# Patient Record
Sex: Female | Born: 1975 | Race: Black or African American | Hispanic: No | Marital: Single | Smoking: Former smoker
Health system: Southern US, Community
[De-identification: ages and names within clinical notes are randomized; demographics above are authoritative.]

## PROBLEM LIST (undated history)

## (undated) DIAGNOSIS — F32A Depression, unspecified: Secondary | ICD-10-CM

## (undated) DIAGNOSIS — M5126 Other intervertebral disc displacement, lumbar region: Secondary | ICD-10-CM

## (undated) DIAGNOSIS — F419 Anxiety disorder, unspecified: Secondary | ICD-10-CM

## (undated) HISTORY — DX: Other intervertebral disc displacement, lumbar region: M51.26

## (undated) HISTORY — DX: Depression, unspecified: F32.A

## (undated) HISTORY — PX: OTHER SURGICAL HISTORY: SHX169

## (undated) HISTORY — DX: Anxiety disorder, unspecified: F41.9

---

## 2016-11-06 ENCOUNTER — Ambulatory Visit: Payer: Medicaid Other | Attending: Internal Medicine | Admitting: Physical Therapy

## 2016-11-06 DIAGNOSIS — M545 Low back pain, unspecified: Secondary | ICD-10-CM

## 2016-11-06 DIAGNOSIS — M62830 Muscle spasm of back: Secondary | ICD-10-CM

## 2016-11-07 NOTE — Therapy (Signed)
[facility] [address] [place], [place], [address] Phone: [phone]   Fax:  [phone]  Physical Therapy Evaluation  Patient Details  Name: Robin Wright MRN: [id number] Date of Birth: 13-Jul-1976 Referring Provider: Dr Lauralee Evener  Encounter Date: 11/06/2016      PT End of Session - 11/07/16 1122    Visit Number 1   Number of Visits 1   Date for PT Re-Evaluation 11/07/16   Authorization Type [employer] 1x visit    PT Start Time 1100   PT Stop Time 1145   PT Time Calculation (min) 45 min   Activity Tolerance Patient tolerated treatment well   Behavior During Therapy [facility] for tasks assessed/performed      No past medical history on file.  No past surgical history on file.  There were no vitals filed for this visit.       Subjective Assessment - 11/07/16 1008    Subjective Patrient is a 40 year old female with lower back pain. She has core weakness and bilateral lower extremity weakness. She has limited spinal mobility with flexion and extension. She has increased pain with ambulation.     Limitations Standing;Walking   How long can you sit comfortably? < 20 min    How long can you stand comfortably? < 5 min before she feels pain    How long can you walk comfortably? < 300 ft before she feels pain    Currently in Pain? Yes   Pain Score 8    Pain Location Back   Pain Orientation Lower;Mid   Pain Descriptors / Indicators Aching   Pain Type Chronic pain   Pain Onset More than a month ago   Pain Frequency Intermittent   Aggravating Factors  standing, walking, prolonged sitting    Pain Relieving Factors rest    Effect of Pain on Daily Activities difficulty perfroming ADL's    Multiple Pain Sites No            [facility] PT Assessment - 11/07/16 0001      Assessment   Medical Diagnosis Low back pain    Referring Provider Dr Lauralee Evener   Onset Date/Surgical Date --  2010   Hand Dominance Right   Next MD  Visit None    Prior Therapy Yes in [place] for the lower back.      Precautions   Precautions None     Restrictions   Weight Bearing Restrictions No     Balance Screen   Has the patient fallen in the past 6 months No     Home Environment   Additional Comments Patient lives at home. 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  She also has several children at home.      Prior Function   Vocation Requirements Takes care of her mother    Leisure Micah Flesher to a gym up in [place]      Cognition   Overall Cognitive Status Within Functional Limits for tasks assessed   Attention Focused     Observation/Other Assessments   Focus on Therapeutic Outcomes (FOTO)  Medicaid      Sensation   Additional Comments Pain into the top of the foot on the right side.  Pain down into the left side.      Coordination   Gross Motor Movements are Fluid and Coordinated Yes   Fine Motor Movements are Fluid and Coordinated Yes     Posture/Postural Control  Posture Comments rounded shoulders, flexed forward in sittting      AROM   Lumbar Flexion limited 50% with pain    Lumbar Extension limited 50% with pain    Lumbar - Right Rotation limited 25% with pain    Lumbar - Left Rotation limited 25% with pain      PROM   Overall PROM Comments Limited hip flexion ER, and IR bilateral      Strength   Strength Assessment Site Hip   Right/Left Hip Right;Left   Right Hip Flexion 4/5   Right Hip Extension 4/5   Right Hip ABduction 4/5   Right Hip ADduction 4/5   Left Hip Flexion 4/5   Left Hip Extension 4/5   Left Hip ABduction 4/5   Left Hip ADduction 4/5     Palpation   SI assessment  Limited SI movement    Palpation comment spasming in bilateral lumbar paraspinals      Special Tests    Special Tests --  SLR (-) bilateral      Ambulation/Gait   Gait Comments limited stride length, limited hip rotation                            PT Education - 11/07/16 1122     Education provided Yes   Education Details HEP, significant education on symptom mangement and progression of activity    Person(s) Educated Patient   Methods Explanation;Demonstration   Comprehension Verbalized understanding;Returned demonstration          PT Short Term Goals - 11/07/16 1132      PT SHORT TERM GOAL #1   Title Patient will be indepdnent with HEP    Time 1   Period Days   Status Achieved     PT SHORT TERM GOAL #2   Title Patient will verbalize the improtance of symptom mangement    Time 1   Period Days   Status New                  Plan - 11/07/16 1124    Clinical Impression Statement Patient is a 40 year old female with low back pain. She presents with core and lower extremity weakness. She has limited hip mobility. She was given a porgram along with exercise progression.    Rehab Potential Good   PT Frequency 2x / week   PT Duration 8 weeks   PT Treatment/Interventions Moist Heat;Traction;Ultrasound;ADLs/Self Care Home Management;Cryotherapy;Electrical Stimulation;Stair training;Gait training;Functional mobility training;Therapeutic activities;Therapeutic exercise;Neuromuscular re-education;Cognitive remediation;Manual techniques;Patient/family education;Dry needling;Splinting;Taping   PT Next Visit Plan 1x visit per medicaid    PT Home Exercise Plan see patient instructions    Consulted and Agree with Plan of Care Patient      Patient will benefit from skilled therapeutic intervention in order to improve the following deficits and impairments:  Abnormal gait, Pain, Obesity, Decreased strength, Decreased mobility, Decreased activity tolerance, Decreased endurance, Difficulty walking, Decreased range of motion, Increased fascial restricitons  Visit Diagnosis: Acute bilateral low back pain without sciatica - Plan: PT plan of care cert/re-cert  Muscle spasm of back - Plan: PT plan of care cert/re-cert     Problem List There are no active  problems to display for this patient.   Dessie Coma PT DPT  11/07/2016, 11:38 AM  [facility] [address] [place], [place], [address] Phone: [phone]   Fax:  [phone]  Name: Monnette Muellner MRN:  [id number] Date of Birth: 1976/01/10

## 2016-12-26 ENCOUNTER — Ambulatory Visit (INDEPENDENT_AMBULATORY_CARE_PROVIDER_SITE_OTHER): Payer: Medicaid Other | Admitting: Specialist

## 2016-12-26 ENCOUNTER — Encounter (INDEPENDENT_AMBULATORY_CARE_PROVIDER_SITE_OTHER): Payer: Self-pay | Admitting: Specialist

## 2016-12-26 DIAGNOSIS — G8929 Other chronic pain: Secondary | ICD-10-CM

## 2016-12-26 DIAGNOSIS — M5441 Lumbago with sciatica, right side: Secondary | ICD-10-CM | POA: Diagnosis not present

## 2016-12-26 MED ORDER — CYCLOBENZAPRINE HCL 10 MG PO TABS: 10.0000 mg | ORAL_TABLET | Freq: Three times a day (TID) | ORAL | 2 refills | Status: DC | PRN

## 2016-12-26 MED ORDER — NAPROXEN 500 MG PO TABS: 500.0000 mg | ORAL_TABLET | Freq: Two times a day (BID) | ORAL | 3 refills | Status: DC

## 2016-12-26 NOTE — Patient Instructions (Signed)
Avoid frequent bending and stooping  No lifting greater than 10 lbs. May use ice or moist heat for pain. Weight loss is of benefit. Handicap license is approved.   

## 2016-12-26 NOTE — Progress Notes (Signed)
Office Visit Note   Patient: Robin Wright           Date of Birth: 05/10/1976           MRN: [id number] Visit Date: 12/26/2016              Requested by: Fleet ContrasEdwin Avbuere, MD [address] [place], [place] [id number] PCP: Dorrene GermanEdwin A Avbuere, MD   Assessment & Plan: Visit Diagnoses:  1. Chronic right-sided low back pain with right-sided sciatica     Plan: Avoid frequent bending and stooping  No lifting greater than 10 lbs. May use ice or moist heat for pain. Weight loss is of benefit. Handicap license is approved.    Follow-Up Instructions: No Follow-up on file.   Orders:  Orders Placed This Encounter  Procedures  . MR LUMBAR SPINE WO CONTRAST   Meds ordered this encounter  Medications  . cyclobenzaprine (FLEXERIL) 10 MG tablet    Sig: Take 1 tablet (10 mg total) by mouth 3 (three) times daily as needed for muscle spasms.    Dispense:  60 tablet    Refill:  2  . naproxen (NAPROSYN) 500 MG tablet    Sig: Take 1 tablet (500 mg total) by mouth 2 (two) times daily with a meal.    Dispense:  60 tablet    Refill:  3      Procedures: No procedures performed   Clinical Data: No additional findings.   Subjective: Chief Complaint  Patient presents with  . Lower Back - Pain  . Neck - Pain    Ms. Robin Wright is here for low back pain, she was referred by Dr. Winifred OliveAvburee.  She had xrays on 10/24/2016 @ [facility]. She states that she has low back pain that radiates into the right hip and leg.  Her hip gets really sore with increased activity or with just sitting to long.  [place] in [place] in 2010 of her Lumbar spine. She also states that she has pain in her neck and complaining on numbness and tingling in her hands if she sits to long or driving to long.    Review of Systems  HENT: Positive for sinus pressure and sneezing.   Eyes: Negative.   Respiratory: Positive for cough.   Cardiovascular: Negative.   Gastrointestinal: Negative.     Endocrine: Negative.   Genitourinary: Negative.   Musculoskeletal: Positive for back pain, gait problem and joint swelling.  Skin: Negative.   Allergic/Immunologic: Negative.   Neurological: Positive for dizziness, weakness and numbness.  Hematological: Negative.   Psychiatric/Behavioral: Negative.      Objective: Vital Signs: There were no vitals taken for this visit.  Physical Exam  Constitutional: She is oriented to person, place, and time. She appears well-developed and well-nourished.  HENT:  Head: Normocephalic and atraumatic.  Eyes: EOM are normal. Pupils are equal, round, and reactive to light.  Neck: Normal range of motion. Neck supple.  Pulmonary/Chest: Effort normal and breath sounds normal.  Abdominal: Soft. Bowel sounds are normal.  Neurological: She is alert and oriented to person, place, and time.  Skin: Skin is warm and dry.  Psychiatric: She has a normal mood and affect. Her behavior is normal. Judgment and thought content normal.    Back Exam   Tenderness  The patient is experiencing tenderness in the lumbar.  Range of Motion  Extension:  10 abnormal  Flexion:  40 abnormal  Lateral Bend Right: normal  Lateral Bend  Left: normal  Rotation Right: normal  Rotation Left: normal   Muscle Strength  Right Quadriceps:  5/5  Left Quadriceps:  5/5  Right Hamstrings:  5/5  Left Hamstrings:  5/5   Tests  Straight leg raise right: negative Straight leg raise left: negative  Reflexes  Patellar: normal Achilles: normal Babinski's sign: normal   Other  Toe Walk: normal Heel Walk: normal Sensation: normal Gait: abnormal  Erythema: no back redness Scars: absent  Comments:  Thigh  Circumference is symmetric, calf circumference is symmetric      Specialty Comments:  No specialty comments available.  Imaging: No results found.   PMFS History: There are no active problems to display for this patient.  No past medical history on file.  No  family history on file.  No past surgical history on file. Social History   Occupational History  . Not on file.   Social History Main Topics  . Smoking status: Current Every Day Smoker    Packs/day: 0.50    Types: Cigarettes    Start date: 12/26/2001  . Smokeless tobacco: Current User    Types: Snuff  . Alcohol use No  . Drug use: No  . Sexual activity: Not on file

## 2017-01-19 ENCOUNTER — Ambulatory Visit
Admission: RE | Admit: 2017-01-19 | Discharge: 2017-01-19 | Disposition: A | Discharge status: Home / Self Care | Payer: Medicaid Other | Source: Ambulatory Visit | Attending: Specialist | Admitting: Specialist

## 2017-02-04 ENCOUNTER — Ambulatory Visit (INDEPENDENT_AMBULATORY_CARE_PROVIDER_SITE_OTHER): Payer: Medicaid Other | Admitting: Specialist

## 2017-02-07 ENCOUNTER — Ambulatory Visit (INDEPENDENT_AMBULATORY_CARE_PROVIDER_SITE_OTHER): Payer: Medicaid Other | Admitting: Specialist

## 2017-04-11 ENCOUNTER — Encounter (INDEPENDENT_AMBULATORY_CARE_PROVIDER_SITE_OTHER): Payer: Self-pay | Admitting: Specialist

## 2017-04-11 ENCOUNTER — Ambulatory Visit (INDEPENDENT_AMBULATORY_CARE_PROVIDER_SITE_OTHER): Payer: Medicaid Other | Admitting: Specialist

## 2017-04-11 VITALS — BP 125/82 | HR 88 | Ht 66.0 in | Wt 318.0 lb

## 2017-04-11 DIAGNOSIS — G8929 Other chronic pain: Secondary | ICD-10-CM | POA: Diagnosis not present

## 2017-04-11 DIAGNOSIS — R202 Paresthesia of skin: Secondary | ICD-10-CM

## 2017-04-11 DIAGNOSIS — R200 Anesthesia of skin: Secondary | ICD-10-CM | POA: Diagnosis not present

## 2017-04-11 DIAGNOSIS — M5440 Lumbago with sciatica, unspecified side: Secondary | ICD-10-CM | POA: Diagnosis not present

## 2017-04-11 DIAGNOSIS — M5441 Lumbago with sciatica, right side: Secondary | ICD-10-CM | POA: Diagnosis not present

## 2017-04-11 DIAGNOSIS — M5136 Other intervertebral disc degeneration, lumbar region: Secondary | ICD-10-CM | POA: Diagnosis not present

## 2017-04-11 MED ORDER — CYCLOBENZAPRINE HCL 10 MG PO TABS: 10.0000 mg | ORAL_TABLET | Freq: Three times a day (TID) | ORAL | 2 refills | Status: DC | PRN

## 2017-04-11 MED ORDER — GABAPENTIN 100 MG PO CAPS: 100.0000 mg | ORAL_CAPSULE | Freq: Three times a day (TID) | ORAL | 6 refills | Status: AC

## 2017-04-11 MED ORDER — NAPROXEN 500 MG PO TABS: 500.0000 mg | ORAL_TABLET | Freq: Two times a day (BID) | ORAL | 3 refills | Status: DC

## 2017-04-11 NOTE — Progress Notes (Addendum)
Office Visit Note   Patient: Robin Wright           Date of Birth: 11-24-1975           MRN: [id number] Visit Date: 04/11/2017              Requested by: Fleet Contras, MD [address] [place], [place] [address] PCP: Fleet Contras, MD   Assessment & Plan: Visit Diagnoses:  1. Degenerative disc disease, lumbar   2. Bilateral low back pain with sciatica, sciatica laterality unspecified, unspecified chronicity   3. Numbness and tingling of both feet   4. Bilateral hand numbness   5. Chronic right-sided low back pain with right-sided sciatica   41 year old female with low back pain, brother that is disabled and has carpal tunnel syndrome. She complains of falls and lower extremity weakness, injury to her back In [place], work related and is seeking disability evaluation in [place], her complaints may be due to carpal tunnel and tarsal tunnel syndrome. Her MRI doesn't explain her  Level of disability and falling episodes. I recommend a Neurology evaluation, there is no present surgical lesion, however neurology eval with EMGs/NCV may help determine If she needs treatment of a carpal tunnel syndrome. She is obese and needs to consider weight loss as this may be the most beneficial treatment to present premature Wear and degeneration of her joints and spine. I will send back to physical therapy, recommend use of NSAIDs, weight loss, exercise and thorough evaluation for periphral neuropathy.   Plan:Avoid frequent bending and stooping  No lifting greater than 10-15 lbs. May use ice or moist heat for pain. Weight loss is of benefit.  Follow-Up Instructions: Return in about 3 months (around 07/12/2017).   Orders:  Orders Placed This Encounter  Procedures  . Ambulatory referral to Neurology   Meds ordered this encounter  Medications  . cyclobenzaprine (FLEXERIL) 10 MG tablet    Sig: Take 1 tablet (10 mg total) by mouth 3 (three) times daily as needed for muscle spasms.    Dispense:   60 tablet    Refill:  2  . naproxen (NAPROSYN) 500 MG tablet    Sig: Take 1 tablet (500 mg total) by mouth 2 (two) times daily with a meal.    Dispense:  60 tablet    Refill:  3  . gabapentin (NEURONTIN) 100 MG capsule    Sig: Take 1 capsule (100 mg total) by mouth 3 (three) times daily.    Dispense:  30 capsule    Refill:  6      Procedures: No procedures performed   Clinical Data: No additional findings.   Subjective: Chief Complaint  Patient presents with  . Lower Back - Pain    41 year old female with history of low back pain and pain with bending and stooping and standing too long. She has pain with heavy lifting. 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 Her brother has carpal tunnel and arthritis and diabetes. She does not have diabetes. Told that her sugars. She is applying for SSDI, And is employing a lawyer to assist with her case. She loses her balance a lot and is concern about falls.     Review of Systems  Constitutional: Positive for appetite change and fatigue.  HENT: Negative.  Negative for sinus pain, sinus pressure and sore throat.   Eyes: Negative.  Negative for photophobia, pain,  redness and visual disturbance.  Respiratory: Positive for cough and shortness of breath. Negative for chest tightness and wheezing.   Cardiovascular: Negative.  Negative for leg swelling.  Gastrointestinal: Negative.  Negative for constipation, diarrhea, nausea, rectal pain and vomiting.  Endocrine: Negative.   Genitourinary: Negative.  Negative for frequency and vaginal bleeding.  Musculoskeletal: Positive for back pain and gait problem.  Skin: Negative.  Negative for color change, pallor, rash and wound.  Allergic/Immunologic: Negative.   Neurological: Positive for weakness, light-headedness, numbness and headaches.  Hematological: Negative.   Psychiatric/Behavioral: Positive for dysphoric mood. Negative for suicidal  ideas. The patient is nervous/anxious.      Objective: Vital Signs: BP 125/82 (BP Location: Left Arm, Patient Position: Sitting)   Pulse 88   Ht 5\' 6"  (1.676 m)   Wt (!) 318 lb (144.2 kg)   BMI 51.33 kg/m   Physical Exam  Constitutional: She is oriented to person, place, and time. She appears well-developed and well-nourished.  HENT:  Head: Normocephalic and atraumatic.  Eyes: EOM are normal. Pupils are equal, round, and reactive to light.  Neck: Normal range of motion. Neck supple.  Pulmonary/Chest: Effort normal and breath sounds normal.  Abdominal: Soft. Bowel sounds are normal.  Neurological: She is alert and oriented to person, place, and time.  Skin: Skin is warm and dry.  Psychiatric: She has a normal mood and affect. Her behavior is normal. Judgment and thought content normal.    Back Exam   Tenderness  The patient is experiencing tenderness in the lumbar.  Range of Motion  Extension: abnormal  Flexion: abnormal  Lateral Bend Right: normal  Lateral Bend Left: normal  Rotation Right: normal  Rotation Left: normal   Muscle Strength  Right Quadriceps:  5/5  Left Quadriceps:  5/5  Right Hamstrings:  5/5  Left Hamstrings:  5/5   Tests  Straight leg raise right: negative Straight leg raise left: negative  Reflexes  Babinski's sign: normal   Other  Toe Walk: abnormal Heel Walk: abnormal Sensation: normal Gait: normal  Erythema: no back redness Scars: absent      Specialty Comments:  No specialty comments available.  Imaging: No results found.   PMFS History: There are no active problems to display for this patient.  No past medical history on file.  No family history on file.  No past surgical history on file. Social History   Occupational History  . Not on file.   Social History Main Topics  . Smoking status: Current Every Day Smoker    Packs/day: 0.50    Types: Cigarettes    Start date: 12/26/2001  . Smokeless tobacco: Current  User    Types: Snuff  . Alcohol use No  . Drug use: No  . Sexual activity: Not on file

## 2017-04-11 NOTE — Patient Instructions (Signed)
Avoid frequent bending and stooping  No lifting greater than 10-15 lbs. May use ice or moist heat for pain. Weight loss is of benefit.

## 2017-06-04 ENCOUNTER — Encounter: Payer: Self-pay | Admitting: Neurology

## 2017-06-04 ENCOUNTER — Ambulatory Visit (INDEPENDENT_AMBULATORY_CARE_PROVIDER_SITE_OTHER): Payer: Medicaid Other | Admitting: Neurology

## 2017-06-04 ENCOUNTER — Encounter (INDEPENDENT_AMBULATORY_CARE_PROVIDER_SITE_OTHER): Payer: Self-pay

## 2017-06-04 DIAGNOSIS — R413 Other amnesia: Secondary | ICD-10-CM | POA: Insufficient documentation

## 2017-06-04 DIAGNOSIS — M542 Cervicalgia: Secondary | ICD-10-CM | POA: Diagnosis not present
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
## 2017-06-06 ENCOUNTER — Telehealth: Payer: Self-pay | Admitting: Neurology

## 2017-06-06 LAB — CBC WITH DIFFERENTIAL/PLATELET
Basophils Absolute: 0 10*3/uL (ref 0.0–0.2)
Basos: 0 %
EOS (ABSOLUTE): 0.1 10*3/uL (ref 0.0–0.4)
Eos: 2 %
Hematocrit: 36.6 % (ref 34.0–46.6)
Hemoglobin: 10.6 g/dL — ABNORMAL LOW (ref 11.1–15.9)
IMMATURE GRANULOCYTES: 0 %
Immature Grans (Abs): 0 10*3/uL (ref 0.0–0.1)
LYMPHS ABS: 1.9 10*3/uL (ref 0.7–3.1)
Lymphs: 50 %
MCH: 24.5 pg — ABNORMAL LOW (ref 26.6–33.0)
MCHC: 29 g/dL — AB (ref 31.5–35.7)
MCV: 85 fL (ref 79–97)
MONOCYTES: 8 %
MONOS ABS: 0.3 10*3/uL (ref 0.1–0.9)
NEUTROS PCT: 40 %
Neutrophils Absolute: 1.5 10*3/uL (ref 1.4–7.0)
Platelets: 350 10*3/uL (ref 150–379)
RBC: 4.32 x10E6/uL (ref 3.77–5.28)
RDW: 18.4 % — ABNORMAL HIGH (ref 12.3–15.4)
WBC: 3.7 10*3/uL (ref 3.4–10.8)

## 2017-06-06 LAB — COMPREHENSIVE METABOLIC PANEL
A/G RATIO: 1.1 — AB (ref 1.2–2.2)
ALT: 13 IU/L (ref 0–32)
AST: 13 IU/L (ref 0–40)
Albumin: 3.5 g/dL (ref 3.5–5.5)
Alkaline Phosphatase: 59 IU/L (ref 39–117)
BILIRUBIN TOTAL: 0.2 mg/dL (ref 0.0–1.2)
BUN/Creatinine Ratio: 11 (ref 9–23)
BUN: 8 mg/dL (ref 6–24)
CHLORIDE: 104 mmol/L (ref 96–106)
CO2: 21 mmol/L (ref 20–29)
Calcium: 8.9 mg/dL (ref 8.7–10.2)
Creatinine, Ser: 0.74 mg/dL (ref 0.57–1.00)
GFR calc Af Amer: 117 mL/min/{1.73_m2} (ref >59)
GFR calc non Af Amer: 102 mL/min/{1.73_m2} (ref >59)
GLUCOSE: 89 mg/dL (ref 65–99)
Globulin, Total: 3.3 g/dL (ref 1.5–4.5)
POTASSIUM: 4.3 mmol/L (ref 3.5–5.2)
Sodium: 140 mmol/L (ref 134–144)
Total Protein: 6.8 g/dL (ref 6.0–8.5)

## 2017-06-06 LAB — C-REACTIVE PROTEIN: CRP: 8.4 mg/L — AB (ref 0.0–4.9)

## 2017-06-06 LAB — VITAMIN B12: VITAMIN B 12: 299 pg/mL (ref 232–1245)

## 2017-06-06 LAB — RPR: RPR: NONREACTIVE

## 2017-06-06 LAB — VITAMIN D 25 HYDROXY (VIT D DEFICIENCY, FRACTURES): Vit D, 25-Hydroxy: 14.8 ng/mL — ABNORMAL LOW (ref 30.0–100.0)

## 2017-06-06 LAB — SEDIMENTATION RATE: SED RATE: 15 mm/h (ref 0–32)

## 2017-06-06 LAB — ANA W/REFLEX IF POSITIVE: Anti Nuclear Antibody(ANA): NEGATIVE

## 2017-06-06 LAB — HIV ANTIBODY (ROUTINE TESTING W REFLEX): HIV SCREEN 4TH GENERATION: NONREACTIVE

## 2017-06-06 NOTE — Telephone Encounter (Signed)
She is aware of results and agreeable to start the recommended supplement.

## 2017-06-06 NOTE — Telephone Encounter (Signed)
Please call patient laboratory evaluation showed low vitamin D level 14.8, she should take over-the-counter vitamin D3 supplement 1000 units daily.  Also evidence of mild anemia, with hemoglobin of 10.6, rest of the laboratory showed no significant abnormality

## 2017-07-12 ENCOUNTER — Encounter (INDEPENDENT_AMBULATORY_CARE_PROVIDER_SITE_OTHER): Payer: Self-pay | Admitting: Specialist

## 2017-07-12 ENCOUNTER — Ambulatory Visit (INDEPENDENT_AMBULATORY_CARE_PROVIDER_SITE_OTHER): Payer: Medicaid Other | Admitting: Specialist

## 2017-07-12 VITALS — BP 103/68 | HR 90

## 2017-07-12 DIAGNOSIS — M47894 Other spondylosis, thoracic region: Secondary | ICD-10-CM

## 2017-07-12 DIAGNOSIS — M5136 Other intervertebral disc degeneration, lumbar region: Secondary | ICD-10-CM | POA: Diagnosis not present

## 2017-07-12 NOTE — Addendum Note (Signed)
Addended by: Vira Browns on: 07/12/2017 02:28 PM   Modules accepted: Level of Service

## 2017-07-12 NOTE — Patient Instructions (Addendum)
Avoid frequent bending and stooping  No lifting greater than 15 lbs. . May use ice or moist heat for pain. Weight loss is of benefit. Anemia needs to be worked up and you should see your primary Dr. [name] to have this worked up. When the MRI of the Brain and of the neck are complete we may be able to say more about why you are having  Falling out spells. Presently I do not have a good surgical solution to your lumbar and thoracic disc degeneration and I do not recommend Surgery.  I recommend a weight reduction program and this may be initiated by your primary care physician.

## 2017-07-12 NOTE — Progress Notes (Addendum)
Office Visit Note   Patient: Robin Wright           Date of Birth: 20-Jun-1976           MRN: [id number] Visit Date: 07/12/2017              Requested by: Fleet Contras, MD [address] [place], [place] [address] PCP: Fleet Contras, MD   Assessment & Plan: Visit Diagnoses:  1. Other osteoarthritis of spine, thoracic region   2. Degenerative disc disease, lumbar     Plan:Avoid frequent bending and stooping  No lifting greater than 10 lbs. May use ice or moist heat for pain. Weight loss is of benefit. Anemia needs to be worked up and you should see your primary Dr. Concepcion Elk to have this worked up. When the MRI of the Brain and of the neck are complete we may be able to say more about why you are having  Falling out spells. Presently I do not have a good surgical solution to your lumbar and thoracic disc degeneration and I do not recommend Surgery.  I recommend a weight reduction program and this may be initiated by your primary care physician.     Follow-Up Instructions: No Follow-up on file.   Orders:  No orders of the defined types were placed in this encounter.  No orders of the defined types were placed in this encounter.     Procedures: No procedures performed   Clinical Data: No additional findings.   Subjective: Chief Complaint  Patient presents with  . Lower Back - Pain    41 year old female with history of back pain starts in the lower back and shoots up into her neck. Standing too long causes pain and she is better with sitting and then when sitting too long she has to stand and walk. Some days she is unable to even drive because she can not turn her back. Also into her neck and between her shoulder blades. No bowel difficulties, reports she doe use the bathroom voiding more frequently. Cough or sneeze without pain, daughter assists with socks because it hurts to bend down.  She wears slip on shoes for ease of slipping on and off. Complains of  numbness in the right side more than left in all her limbs. She had nerve tests done by neurology in [place] and had them repeated in [place] in the past last done in 2016, like October. Has seen neurology, Dr. Terrace Arabia and underwent  Blood work demonstrating anemia with Hgb 10.6, WBC and Platelets are normal. Sed rate 15. She has concerns about financial costs and is unable to have the MRI of the cervical spine and the brain that Dr. Terrace Arabia felt was indicated.    Review of Systems  Constitutional: Positive for activity change and fatigue.  HENT: Negative.  Negative for congestion, dental problem, drooling, ear discharge, ear pain, facial swelling, hearing loss, mouth sores, nosebleeds, postnasal drip, rhinorrhea, sinus pain, sinus pressure, sneezing, sore throat, tinnitus, trouble swallowing and voice change.   Eyes: Positive for visual disturbance. Negative for pain, discharge, redness and itching.  Respiratory: Negative.  Negative for shortness of breath and wheezing.   Cardiovascular: Positive for leg swelling. Negative for chest pain and palpitations.  Gastrointestinal: Positive for abdominal pain. Negative for abdominal distention, blood in stool, constipation, diarrhea, nausea and vomiting.  Endocrine: Negative for heat intolerance.  Genitourinary: Negative for difficulty urinating, dyspareunia, dysuria, enuresis and flank pain.  Musculoskeletal: Positive for arthralgias,  back pain, gait problem, joint swelling, neck pain and neck stiffness.  Skin: Negative for color change, pallor, rash and wound.  Allergic/Immunologic: Negative for environmental allergies, food allergies and immunocompromised state.  Neurological: Positive for weakness and numbness. Negative for dizziness, seizures, syncope, facial asymmetry, speech difficulty, light-headedness and headaches.  Hematological: Does not bruise/bleed easily.  Psychiatric/Behavioral: Negative for agitation, behavioral problems, confusion,  decreased concentration, dysphoric mood, hallucinations, self-injury, sleep disturbance and suicidal ideas. The patient is not nervous/anxious and is not hyperactive.      Objective: Vital Signs: BP 103/68   Pulse 90   Physical Exam  Constitutional: She is oriented to person, place, and time. She appears well-developed and well-nourished.  HENT:  Head: Normocephalic and atraumatic.  Eyes: Pupils are equal, round, and reactive to light. EOM are normal.  Neck: Normal range of motion. Neck supple.  Pulmonary/Chest: Effort normal and breath sounds normal.  Abdominal: Soft. Bowel sounds are normal.  Neurological: She is alert and oriented to person, place, and time.  Skin: Skin is warm and dry.  Psychiatric: She has a normal mood and affect. Her behavior is normal. Judgment and thought content normal.    Back Exam   Tenderness  The patient is experiencing tenderness in the lumbar.  Range of Motion  Extension: normal  Flexion: normal  Lateral Bend Right: abnormal  Lateral Bend Left: abnormal  Rotation Right: abnormal  Rotation Left: abnormal   Muscle Strength  Right Quadriceps:  5/5  Left Quadriceps:  5/5  Right Hamstrings:  5/5  Left Hamstrings:  5/5   Tests  Straight leg raise right: negative Straight leg raise left: negative  Reflexes  Patellar: 2/4 Achilles: 0/4 Biceps: 2/4 Babinski's sign: normal   Other  Toe Walk: normal Heel Walk: normal Sensation: normal  Comments:  Right ankle reflex 0 and left 2+ Bilateral tight Heel cords dorsiflexion to neutral only. Motor without focal deficit.       Specialty Comments:  No specialty comments available.  Imaging: No results found.   PMFS History: Patient Active Problem List   Diagnosis Date Noted  . Memory loss 06/04/2017  . Neck pain 06/04/2017   History reviewed. No pertinent past medical history.  Family History  Problem Relation Age of Onset  . Hypertension Mother   . Diabetes Mother   .  Cirrhosis Father     Past Surgical History:  Procedure Laterality Date  . c-section     Social History   Occupational History  . Not on file.   Social History Main Topics  . Smoking status: Current Every Day Smoker    Packs/day: 0.50    Types: Cigarettes    Start date: 12/26/2001  . Smokeless tobacco: Current User    Types: Snuff  . Alcohol use No  . Drug use: No  . Sexual activity: Not on file

## 2017-07-18 ENCOUNTER — Telehealth (INDEPENDENT_AMBULATORY_CARE_PROVIDER_SITE_OTHER): Payer: Self-pay | Admitting: Specialist

## 2017-07-18 NOTE — Telephone Encounter (Signed)
Patient asked for rx for antacid to be put in. Patient uses the [facility] on [address]. Please advise

## 2017-07-18 NOTE — Telephone Encounter (Signed)
Patient asked for rx for antacid to be put in. Patient uses the [facility] on [address].

## 2017-07-31 ENCOUNTER — Ambulatory Visit
Admission: RE | Admit: 2017-07-31 | Discharge: 2017-07-31 | Disposition: A | Discharge status: Home / Self Care | Payer: Medicaid Other | Source: Ambulatory Visit | Attending: Neurology | Admitting: Neurology

## 2017-07-31 DIAGNOSIS — R413 Other amnesia: Secondary | ICD-10-CM

## 2017-07-31 DIAGNOSIS — M542 Cervicalgia: Secondary | ICD-10-CM

## 2017-08-05 ENCOUNTER — Telehealth: Payer: Self-pay | Admitting: Neurology

## 2017-08-05 NOTE — Telephone Encounter (Addendum)
Spoke to patient - she is aware of results.  Keep pending follow up for further review.  

## 2017-08-05 NOTE — Telephone Encounter (Signed)
Please call patient MRI cervical spine showed mild degenerative changes, but there is no evidence of nerve root or spinal cord compression  IMPRESSION:  Abnormal MRI scan cervical spine showing loss of forward lordotic curvature and mild disc degenerative changes most prominent at C4-5- and C 5-6 where  there is diffuse disc bulge and mild canal and bilateral foraminal narrowing but without definite compression.

## 2017-08-05 NOTE — Telephone Encounter (Signed)
Please call patient MRI of the brain showed no significant abnormality, MRI of the cervical spine showed degenerative changes but there was no evidence of nerve root or spinal cord compression  IMPRESSION:  Abnormal MRI scan cervical spine showing loss of forward lordotic curvature and mild disc degenerative changes most prominent at C4-5- and C 5-6 where  there is diffuse disc bulge and mild canal and bilateral foraminal narrowing but without definite compression.

## 2017-08-05 NOTE — Telephone Encounter (Signed)
Spoke to patient - she is aware of results.  Keep pending follow up for further review.

## 2017-09-05 ENCOUNTER — Telehealth: Payer: Self-pay | Admitting: *Deleted

## 2017-09-05 ENCOUNTER — Telehealth: Payer: Self-pay | Admitting: Nurse Practitioner

## 2017-09-05 ENCOUNTER — Ambulatory Visit: Payer: Medicaid Other | Admitting: Neurology

## 2017-09-05 ENCOUNTER — Telehealth: Payer: Self-pay | Admitting: Neurology

## 2017-09-05 NOTE — Telephone Encounter (Signed)
Pt missed appt today with Dr Terrace ArabiaYan, lack of transportation. Pt is going to contact [facility] for transportation information. Pt was coming for MRI results. She has been seen for memory loss and neck pain. Pt has appt with Eber Jonesarolyn 10/22

## 2017-09-05 NOTE — Telephone Encounter (Signed)
No showed follow up appointment. 

## 2017-09-05 NOTE — Telephone Encounter (Signed)
I dont do MRI results

## 2017-09-05 NOTE — Telephone Encounter (Signed)
I called the pt back to r/s the appt with Dr Terrace ArabiaYan. The patient was in therapy and could not talk. Pt will call back to r.s the appt. Dr Terrace ArabiaYan is booked into January. Pt would like a call back to discuss MRI results please

## 2017-09-06 ENCOUNTER — Encounter: Payer: Self-pay | Admitting: Neurology

## 2017-09-06 NOTE — Telephone Encounter (Signed)
Please call patient MRI of the brain showed no significant abnormality, MRI of the cervical spine showed degenerative changes but there was no evidence of nerve root or spinal cord compression  IMPRESSION: Abnormal MRI scan cervical spine showing loss of forward lordotic curvature and mild disc degenerative changes most prominent at C4-5- and C 5-6 where there is diffuse disc bulge and mild canal and bilateral foraminal narrowing but without definite compression.

## 2017-09-06 NOTE — Telephone Encounter (Signed)
Per vo by Dr. Terrace ArabiaYan, she does not need to follow up here for her neck pain.  She has a pending appt with Dr. Otelia SergeantNitka (orthopaedics) that she should keep.  Instructed her to take her MRI disc with her to the appt.  The results were provided to her today during our phone conversation.  She verbalized understanding.

## 2017-09-09 ENCOUNTER — Ambulatory Visit: Payer: Self-pay | Admitting: Nurse Practitioner

## 2018-01-13 ENCOUNTER — Ambulatory Visit (INDEPENDENT_AMBULATORY_CARE_PROVIDER_SITE_OTHER): Payer: Medicaid Other | Admitting: Specialist

## 2018-07-29 ENCOUNTER — Encounter: Payer: Self-pay | Admitting: Internal Medicine

## 2018-08-29 ENCOUNTER — Emergency Department (HOSPITAL_COMMUNITY): Payer: Medicaid Other

## 2018-08-29 ENCOUNTER — Other Ambulatory Visit: Payer: Self-pay

## 2018-08-29 ENCOUNTER — Emergency Department (HOSPITAL_COMMUNITY)
Admission: EM | Admit: 2018-08-29 | Discharge: 2018-08-29 | Disposition: A | Discharge status: Home / Self Care | Payer: Medicaid Other | Attending: Emergency Medicine | Admitting: Emergency Medicine

## 2018-08-29 ENCOUNTER — Encounter (HOSPITAL_COMMUNITY): Payer: Self-pay

## 2018-08-29 ENCOUNTER — Ambulatory Visit (HOSPITAL_BASED_OUTPATIENT_CLINIC_OR_DEPARTMENT_OTHER): Payer: Medicaid Other

## 2018-08-29 DIAGNOSIS — Z79899 Other long term (current) drug therapy: Secondary | ICD-10-CM | POA: Insufficient documentation

## 2018-08-29 DIAGNOSIS — R2242 Localized swelling, mass and lump, left lower limb: Secondary | ICD-10-CM | POA: Diagnosis not present

## 2018-08-29 DIAGNOSIS — R52 Pain, unspecified: Secondary | ICD-10-CM | POA: Diagnosis not present

## 2018-08-29 DIAGNOSIS — G8929 Other chronic pain: Secondary | ICD-10-CM | POA: Insufficient documentation

## 2018-08-29 DIAGNOSIS — F17210 Nicotine dependence, cigarettes, uncomplicated: Secondary | ICD-10-CM | POA: Diagnosis not present

## 2018-08-29 DIAGNOSIS — M25562 Pain in left knee: Secondary | ICD-10-CM | POA: Diagnosis not present

## 2018-08-29 MED ORDER — NAPROXEN 500 MG PO TABS: 500.0000 mg | ORAL_TABLET | Freq: Two times a day (BID) | ORAL | 0 refills | Status: DC

## 2018-08-29 NOTE — Progress Notes (Signed)
LLE venous duplex prelim: negative for DVT. Carletha Dawn Eunice, RDMS, RVT  

## 2018-08-29 NOTE — ED Triage Notes (Signed)
Patient presents with left knee pain, starting Feb. 2019, even at rest. Patient reports localized swelling and a "not" to her left posterior knee. Patient reports being seen at [facility] today and was told to report to the ED for [place] to rule out aneurysm/clot to left posterior knee. Per patient UC discharge paperwork "Referred to ED RE: claudication, pulsatile mass on left popliteal fossa. Ultrasound to rule out aneurysm/clot, on 08/29/18." Patient denies SOB/CP. Patient ambulatory, with pain.

## 2018-08-29 NOTE — ED Provider Notes (Signed)
[facility]-EMERGENCY DEPT Provider Note   CSN: [id number] Arrival date & time: 08/29/18  1652     History   Chief Complaint Chief Complaint  Patient presents with  . Knee Pain    left     HPI Robin Wright is a 42 y.o. female presents for evaluation for left knee pain.  Per patient she has had pain since February 2019.  Does not recall an enticing event.  States her left lower extremity will intermittently swell, particularly after she has been standing for long period of time at work.  States she feels pain to the left popliteal fossa. Pain is intermittent in nature.  She rates her pain a 5/10.  Pain does not radiate.  Pain is currently located to the popliteal fossa and posterior calf.  Patient states she was seen at urgent care today and sent to the emergency department for an ultrasound to rule out a DVT.  Denies history of previous blood clots, blood clotting disorder, recent surgery or recent sedentary activity, exogenous hormones or malignancy.  Denies fever, chills, warmth, swelling or erythema to left lower extremity.  She has been taking ibuprofen intermittently for her pain.  HPI  No past medical history on file.  Patient Active Problem List   Diagnosis Date Noted  . Memory loss 06/04/2017  . Neck pain 06/04/2017    Past Surgical History:  Procedure Laterality Date  . c-section       OB History   None      Home Medications    Prior to Admission medications   Medication Sig Start Date End Date Taking? Authorizing Provider  cyclobenzaprine (FLEXERIL) 10 MG tablet Take 1 tablet (10 mg total) by mouth 3 (three) times daily as needed for muscle spasms. 04/11/17   Kerrin Champagne, MD  ferrous sulfate 325 (65 FE) MG tablet Take 325 mg by mouth daily with breakfast.    [provider]  gabapentin (NEURONTIN) 100 MG capsule Take 1 capsule (100 mg total) by mouth 3 (three) times daily. 04/11/17   Kerrin Champagne, MD  naproxen (NAPROSYN) 500  MG tablet Take 1 tablet (500 mg total) by mouth 2 (two) times daily. 08/29/18   Braley Luckenbaugh A, PA-C    Family History Family History  Problem Relation Age of Onset  . Hypertension Mother   . Diabetes Mother   . Cirrhosis Father     Social History Social History   Tobacco Use  . Smoking status: Current Every Day Smoker    Packs/day: 0.50    Types: Cigarettes    Start date: 12/26/2001  . Smokeless tobacco: Current User  Substance Use Topics  . Alcohol use: No  . Drug use: No     Allergies   Patient has no known allergies.   Review of Systems Review of Systems  Constitutional: Negative for activity change, appetite change, chills, diaphoresis, fatigue and fever.  Respiratory: Negative.   Cardiovascular: Negative for chest pain and palpitations.       Intermittent left lower extremity swelling.  Worse at the end of the day after she has been standing for long periods of time.  Musculoskeletal: Negative for arthralgias, back pain, gait problem, joint swelling, myalgias, neck pain and neck stiffness.  Skin: Negative.      Physical Exam Updated Vital Signs BP 119/80 (BP Location: Right Arm)   Pulse 94   Temp 98.1 F (36.7 C) (Oral)   Resp 16   Ht 5\' 6"  (1.676  m)   Wt 134.7 kg   LMP 08/29/2018   SpO2 98%   BMI 47.94 kg/m   Physical Exam  Constitutional: She appears well-developed and well-nourished. No distress.  HENT:  Head: Atraumatic.  Eyes: Pupils are equal, round, and reactive to light.  Neck: Normal range of motion.  Cardiovascular: Normal rate and intact distal pulses.  Pulmonary/Chest: No respiratory distress.  Abdominal: She exhibits no distension.  Musculoskeletal: Normal range of motion.  Tenderness to palpation of left popliteal fossa.  No palpable masses to the popliteal fossa.  Mild tenderness palpation to posterior calf.  No edema, erythema or warmth.  Full range of motion bilateral knees with flexion and extension.  Negative varus and valgus  stress.  Medial joint line tenderness to left knee.  No appreciable swelling to bilateral lower extremities, however patient is obese and this is difficult to assess on exam.  Mild crepitus to bilateral knees.  Neurological: She is alert.  Intact sensation to sharp and dull bilateral lower extremities.  Skin: Skin is warm and dry. She is not diaphoretic.  No edema, erythema or warmth to bilateral lower extremity.  Psychiatric: She has a normal mood and affect.  Nursing note and vitals reviewed.    ED Treatments / Results  Labs (all labs ordered are listed, but only abnormal results are displayed) Labs Reviewed - No data to display  EKG None  Radiology Dg Knee Complete 4 Views Left  Result Date: 08/29/2018 CLINICAL DATA:  Persistent left knee pain since February 2019 even at rest. Localized swelling and a "knot" in the posterior knee. EXAM: LEFT KNEE - COMPLETE 4+ VIEW COMPARISON:  None. FINDINGS: Mild medial femorotibial joint space narrowing with spurring of the medial tibial plateau. Slight joint space narrowing is also noted of the patella. No fracture, intra-articular loose body, or suspicious osseous lesions. Probable small joint effusion. IMPRESSION: Mild degenerative joint space narrowing of the medial femorotibial as well as of the patellofemoral compartment. No acute osseous abnormality. Electronically Signed   By: Tollie Eth M.D.   On: 08/29/2018 19:08    Procedures Procedures (including critical care time)  Medications Ordered in ED Medications - No data to display   Initial Impression / Assessment and Plan / ED Course  I have reviewed the triage vital signs and the nursing notes.  Pertinent labs & imaging results that were available during my care of the patient were reviewed by me and considered in my medical decision making (see chart for details).  42 year old morbidly obese, otherwise well-appearing female presents for evaluation of left knee pain.  Pain started  in February 2019. Was seen at urgent care earlier today and sent to the emergency department to r/o DVT in left knee.  Patient with mild tenderness to palpation to left popliteal fossa.  No palpable masses to popliteal fossa.  No edema, erythema or warmth to bilateral lower extremity, however patient is morbidly obese and this is somewhat difficult to assess.  Will obtain ultrasound to rule out DVT.  Patient does not have any risk factors for blood clots and does not have any known history of previous clotting disorders.  Ultrasound negative for DVT, plain film knee joint space narrowing, possible small effusion.  Low suspicion for septic joint as no erythema, edema or warmth to the knee.  Discussed with patient there has been no obvious cause of her pain determined in the emergency department, however it is possible that she may have some osteoarthritis in her knees which  is worsened by her weight and constant movement at work.  Discussed with patient reevaluation with orthopedist for her chronic knee pain.  She voiced understanding is agreeable for follow-up.  Discussed reasons to return to the emergency department.  Patient stable for discharge at this time.     This note was dictated with voice recognition software.  Despite best efforts at proofreading, errors may occur which can change the documentation meaning. Final Clinical Impressions(s) / ED Diagnoses   Final diagnoses:  Chronic pain of left knee    ED Discharge Orders         Ordered    naproxen (NAPROSYN) 500 MG tablet  2 times daily     08/29/18 1935           Kaylee Trivett A, PA-C 08/29/18 1938    Lorre Nick, MD 08/29/18 2340

## 2018-08-29 NOTE — Discharge Instructions (Addendum)
Your evaluated today for left knee pain.  The ultrasound of your left knee did not show any evidence of blood clot in your leg.  The x-ray of your leg did not show any evidence of fracture or dislocation.  I would recommend follow-up with the orthopedist for reevaluation of your chronic knee pain.  Return to the emergency department for any new or worsening symptoms such as: Contact a doctor if: The pain does not stop. The pain changes or gets worse. You have a fever along with knee pain. Your knee gives out or locks up. Your knee swells, and becomes worse. Get help right away if: Your knee feels warm. You cannot move your knee. You have very bad knee pain. You have chest pain. You have trouble breathing.

## 2018-11-27 ENCOUNTER — Ambulatory Visit: Payer: Medicaid Other | Admitting: Certified Nurse Midwife

## 2019-05-11 ENCOUNTER — Other Ambulatory Visit: Payer: Self-pay | Admitting: Sports Medicine

## 2019-05-11 DIAGNOSIS — M542 Cervicalgia: Secondary | ICD-10-CM

## 2019-05-11 DIAGNOSIS — M545 Low back pain, unspecified: Secondary | ICD-10-CM

## 2019-06-04 ENCOUNTER — Other Ambulatory Visit: Payer: Medicaid Other

## 2020-02-17 ENCOUNTER — Other Ambulatory Visit: Payer: Self-pay

## 2020-02-17 ENCOUNTER — Emergency Department (HOSPITAL_COMMUNITY)
Admission: EM | Admit: 2020-02-17 | Discharge: 2020-02-17 | Disposition: A | Discharge status: Home / Self Care | Payer: Medicaid Other | Attending: Emergency Medicine | Admitting: Emergency Medicine

## 2020-02-17 ENCOUNTER — Encounter (HOSPITAL_COMMUNITY): Payer: Self-pay

## 2020-02-17 DIAGNOSIS — F17210 Nicotine dependence, cigarettes, uncomplicated: Secondary | ICD-10-CM | POA: Insufficient documentation

## 2020-02-17 DIAGNOSIS — Z79899 Other long term (current) drug therapy: Secondary | ICD-10-CM | POA: Insufficient documentation

## 2020-02-17 DIAGNOSIS — M545 Low back pain, unspecified: Secondary | ICD-10-CM

## 2020-02-17 MED ORDER — KETOROLAC TROMETHAMINE 15 MG/ML IJ SOLN
15.0000 mg | Freq: Once | INTRAMUSCULAR | Status: AC
  Administered 2020-02-17: 15 mg via INTRAMUSCULAR
  Filled 2020-02-17: qty 1

## 2020-02-17 MED ORDER — CYCLOBENZAPRINE HCL 10 MG PO TABS: 10.0000 mg | ORAL_TABLET | Freq: Two times a day (BID) | ORAL | 0 refills | Status: DC | PRN

## 2020-02-17 MED ORDER — LIDOCAINE 5 % EX PTCH
1.0000 | MEDICATED_PATCH | Freq: Once | CUTANEOUS | Status: DC
  Administered 2020-02-17: 1 via TRANSDERMAL
  Filled 2020-02-17: qty 1

## 2020-02-17 MED ORDER — CYCLOBENZAPRINE HCL 10 MG PO TABS
10.0000 mg | ORAL_TABLET | Freq: Once | ORAL | Status: AC
  Administered 2020-02-17: 10 mg via ORAL
  Filled 2020-02-17: qty 1

## 2020-02-17 NOTE — ED Provider Notes (Signed)
[facility]-EMERGENCY DEPT Provider Note   CSN: [id number] Arrival date & time: 02/17/20  1834     History Chief Complaint  Patient presents with  . Back Pain    Robin Wright is a 44 y.o. female with past medical history significant for chronic neck and back pain presents to emergency department today with chief complaint of acute onset of right lower back pain.  Patient states pain started approximately 1 hour prior to arrival and is intermittent.  She describes pain as a sharp muscle spasm. The pain radiates to her knee. She rates pain 10/10 in severity. She takes Aleve for her back pain typically however she took 200 mg today and it did not provide any relief. She has a history of a back injury in 2010 that occurred when she was lifting a heavy piece of equipment. She was living in [place] at the time and was going to physical therapy and her pain was easily managed with Aleve. Since moving to [place] and because of the pandemic she has had difficulty establishing care with pcp. She was finally able to get an appointment for next week.  Denies fevers, weight loss, numbness/weakness of upper and lower extremities, bowel/bladder incontinence, urinary retention, history of cancer, saddle anesthesia, history of back surgery, history of IVDA.    History reviewed. No pertinent past medical history.  Patient Active Problem List   Diagnosis Date Noted  . Memory loss 06/04/2017  . Neck pain 06/04/2017    Past Surgical History:  Procedure Laterality Date  . c-section       OB History   No obstetric history on file.     Family History  Problem Relation Age of Onset  . Hypertension Mother   . Diabetes Mother   . Cirrhosis Father     Social History   Tobacco Use  . Smoking status: Current Every Day Smoker    Packs/day: 0.50    Types: Cigarettes    Start date: 12/26/2001  . Smokeless tobacco: Current User  Substance Use Topics  . Alcohol use: No  . Drug  use: No    Home Medications Prior to Admission medications   Medication Sig Start Date End Date Taking? Authorizing Provider  cyclobenzaprine (FLEXERIL) 10 MG tablet Take 1 tablet (10 mg total) by mouth 2 (two) times daily as needed for muscle spasms. 02/17/20   Albrizze, Kaitlyn E, PA-C  ferrous sulfate 325 (65 FE) MG tablet Take 325 mg by mouth daily with breakfast.    [provider]  gabapentin (NEURONTIN) 100 MG capsule Take 1 capsule (100 mg total) by mouth 3 (three) times daily. 04/11/17   Kerrin Champagne, MD  naproxen (NAPROSYN) 500 MG tablet Take 1 tablet (500 mg total) by mouth 2 (two) times daily. 08/29/18   Henderly, Britni A, PA-C    Allergies    Patient has no known allergies.  Review of Systems   Review of Systems  All other systems are reviewed and are negative for acute change except as noted in the HPI.   Physical Exam Updated Vital Signs BP (!) 131/94   Pulse 89   Temp 98.1 F (36.7 C) (Oral)   Resp 18   SpO2 99%   Physical Exam Vitals and nursing note reviewed.  Constitutional:      Appearance: She is well-developed. She is obese. She is not ill-appearing or toxic-appearing.  HENT:     Head: Normocephalic and atraumatic.     Nose: Nose  normal.  Eyes:     General: No scleral icterus.       Right eye: No discharge.        Left eye: No discharge.     Conjunctiva/sclera: Conjunctivae normal.  Neck:     Vascular: No JVD.  Cardiovascular:     Rate and Rhythm: Normal rate and regular rhythm.     Pulses: Normal pulses.          Dorsalis pedis pulses are 2+ on the right side and 2+ on the left side.     Heart sounds: Normal heart sounds.  Pulmonary:     Effort: Pulmonary effort is normal.     Breath sounds: Normal breath sounds.  Abdominal:     General: There is no distension.     Palpations: There is no mass.     Hernia: No hernia is present.  Musculoskeletal:        General: Normal range of motion.       Arms:     Cervical back: Normal  range of motion.     Comments: Tenderness to palpation as depicted in image above. No overlying skin changes.  Skin:    General: Skin is warm and dry.  Neurological:     Mental Status: She is oriented to person, place, and time.     GCS: GCS eye subscore is 4. GCS verbal subscore is 5. GCS motor subscore is 6.     Comments: Fluent speech, no facial droop.  Sensation grossly intact to light touch in the lower extremities bilaterally. No saddle anesthesias. Strength 5/5 with flexion and extension at the bilateral hips, knees, and ankles. No noted gait deficit. Coordination intact with heel to shin testing.   Psychiatric:        Behavior: Behavior normal.     ED Results / Procedures / Treatments   Labs (all labs ordered are listed, but only abnormal results are displayed) Labs Reviewed - No data to display  EKG None  Radiology No results found.  Procedures Procedures (including critical care time)  Medications Ordered in ED Medications  lidocaine (LIDODERM) 5 % 1 patch (1 patch Transdermal Patch Applied 02/17/20 2007)  ketorolac (TORADOL) 15 MG/ML injection 15 mg (has no administration in time range)  cyclobenzaprine (FLEXERIL) tablet 10 mg (10 mg Oral Given 02/17/20 2007)    ED Course  I have reviewed the triage vital signs and the nursing notes.  Pertinent labs & imaging results that were available during my care of the patient were reviewed by me and considered in my medical decision making (see chart for details).    MDM Rules/Calculators/A&P                      Patient with back pain.  No neurological deficits and normal neuro exam.  Patient can walk but states is painful.  No loss of bowel or bladder control.  No concern for cauda equina.  No fever, night sweats, weight loss, h/o cancer, IVDU.  Patient given lidocaine patch, Flexeril and IM Toradol while in the emergency department.  She denies any history of kidney disease, labs from 2018 with normal creatinine.   No recent lab work that I can see. On reassessment pain has improved.  RICE protocol and muscle relaxer indicated and discussed with patient.  Aware she should not drive or work while taking Flexeril as it can make her drowsy.   The patient appears reasonably screened and/or stabilized for discharge  and I doubt any other medical condition or other [facility] requiring further screening, evaluation, or treatment in the ED at this time prior to discharge. The patient is safe for discharge with strict return precautions discussed.  Patient has an appointment scheduled with PCP in 1 week, recommend she keep that for follow-up.   Portions of this note were generated with Lobbyist. Dictation errors may occur despite best attempts at proofreading.   Final Clinical Impression(s) / ED Diagnoses Final diagnoses:  Acute right-sided low back pain without sciatica    Rx / DC Orders ED Discharge Orders         Ordered    cyclobenzaprine (FLEXERIL) 10 MG tablet  2 times daily PRN     02/17/20 2046           Flint Melter 02/17/20 2053    Margette Fast, MD 02/17/20 2307

## 2020-02-17 NOTE — ED Notes (Signed)
An After Visit Summary was printed and given to the patient. Discharge instructions given and no further questions at this time. Pt states she is calling a [facility] home. Pt able to stand and take a few steps unassisted before discharge.

## 2020-02-17 NOTE — Discharge Instructions (Addendum)
   Follow Up: Please follow up with your primary healthcare at your next scheduled appointment.   1. Medications: Alternate 600 mg of ibuprofen and [phone] mg of Tylenol every 3 hours as needed for pain. Do not exceed 4000 mg of Tylenol daily.  Take ibuprofen with food to avoid upset stomach issues.   Muscle relaxants:  These medications can help with muscle tightness that is a cause of lower back pain. Most of these medications can cause drowsiness, and it is not safe to drive or use dangerous machinery while taking them.You can take Flexeril as needed for muscle spasm up to twice daily but do not drive, drink alcohol, or operate heavy machinery while taking this medicine because it may make you drowsy.  I typically recommend taking this medicine only at night when you are going to sleep.  You can also cut these tablets in half if they make you feel very drowsy.  2. Treatment: rest, drink plenty of fluids, gentle stretching as discussed (see attached), alternate ice and heat (or stick with whichever feels best) 20 minutes on 20 minutes off. Maintaining your daily activities, including walking, is encourged, as it will help you get better faster than just staying in bed.    Be aware that if you develop new symptoms, such as a fever, leg weakness, difficulty with or loss of control of your urine or bowels, abdominal pain, or more severe pain, you will need to seek medical attention immediately and  / or return to the Emergency department.

## 2020-02-17 NOTE — ED Triage Notes (Signed)
Pt BIB EMS from home. Pt c/o lower back pain that radiates down right leg to knee area. Pt describes it as sharp pain, intermittent. No recent trauma, hx of chronic back pain.

## 2020-02-22 ENCOUNTER — Emergency Department (HOSPITAL_COMMUNITY)
Admission: EM | Admit: 2020-02-22 | Discharge: 2020-02-22 | Disposition: A | Discharge status: Home / Self Care | Payer: Medicaid Other | Attending: Emergency Medicine | Admitting: Emergency Medicine

## 2020-02-22 ENCOUNTER — Encounter (HOSPITAL_COMMUNITY): Payer: Self-pay | Admitting: Emergency Medicine

## 2020-02-22 DIAGNOSIS — F17210 Nicotine dependence, cigarettes, uncomplicated: Secondary | ICD-10-CM | POA: Insufficient documentation

## 2020-02-22 DIAGNOSIS — M545 Low back pain, unspecified: Secondary | ICD-10-CM

## 2020-02-22 DIAGNOSIS — M79604 Pain in right leg: Secondary | ICD-10-CM | POA: Insufficient documentation

## 2020-02-22 MED ORDER — CYCLOBENZAPRINE HCL 10 MG PO TABS: 10.0000 mg | ORAL_TABLET | Freq: Two times a day (BID) | ORAL | 0 refills | Status: AC | PRN

## 2020-02-22 NOTE — ED Notes (Signed)
Pt verbalizes understanding of DC instructions. Pt belongings returned and is ambulatory out of ED.  

## 2020-02-22 NOTE — ED Triage Notes (Signed)
Pt wanting xray and EKG because when she was seen here on 3/31 didn't have that done and wants to make sure didn't have a stroke. Reports had back pains that radiates down right leg since 3/31. Reports when she drives can't hold the pedal down long. Also hurts when lays down.

## 2020-02-22 NOTE — Discharge Instructions (Signed)
We discussed orthopedist follow up for your back pain.  I have provided a refill on your Flexeril to help with your symptoms.  Please schedule an appointment with your orthopedist in order to obtain further management of your back pain.

## 2020-02-22 NOTE — ED Provider Notes (Signed)
[name] DEPT Provider Note   CSN: [id number] Arrival date & time: 02/22/20  1737     History Chief Complaint  Patient presents with  . Back Pain  . Leg Pain    Robin Wright is a 44 y.o. female.  44 y.o female with a PMH of right knee arthritis presents to the ED with request for EKG along with x-ray.  Patient was evaluated in the ED on March 31 of 2021 for lower back pain, she was sent home with muscle relaxers along with pain medication.  She reports her symptoms are gradually improving.  However, she is requesting an EKG "to make sure I did not have a stroke". She is also requesting an Xray of her back.  She does report some lumbar spine pain which has been recurrent, does report this radiates down to her right leg.  She is ambulatory at baseline with a cane.  According to patient, she is to have physical therapy down when she lived in [place]. She denies any falls, fever, chest pain, shortness of breath or bowel or bladder incontinnece.   The history is provided by the patient and medical records.  Back Pain Associated symptoms: leg pain   Associated symptoms: no abdominal pain, no chest pain, no fever and no headaches   Leg Pain Associated symptoms: back pain   Associated symptoms: no fever        History reviewed. No pertinent past medical history.  Patient Active Problem List   Diagnosis Date Noted  . Memory loss 06/04/2017  . Neck pain 06/04/2017    Past Surgical History:  Procedure Laterality Date  . c-section       OB History   No obstetric history on file.     Family History  Problem Relation Age of Onset  . Hypertension Mother   . Diabetes Mother   . Cirrhosis Father     Social History   Tobacco Use  . Smoking status: Current Every Day Smoker    Packs/day: 0.50    Types: Cigarettes    Start date: 12/26/2001  . Smokeless tobacco: Current User  Substance Use Topics  . Alcohol use: No  . Drug use: No    Home  Medications Prior to Admission medications   Medication Sig Start Date End Date Taking? Authorizing Provider  cyclobenzaprine (FLEXERIL) 10 MG tablet Take 1 tablet (10 mg total) by mouth 2 (two) times daily as needed for up to 7 days for muscle spasms. 02/22/20 02/29/20  Janeece Fitting, PA-C  ferrous sulfate 325 (65 FE) MG tablet Take 325 mg by mouth daily with breakfast.    [provider]  gabapentin (NEURONTIN) 100 MG capsule Take 1 capsule (100 mg total) by mouth 3 (three) times daily. 04/11/17   Jessy Oto, MD  naproxen (NAPROSYN) 500 MG tablet Take 1 tablet (500 mg total) by mouth 2 (two) times daily. 08/29/18   Henderly, Britni A, PA-C    Allergies    Patient has no known allergies.  Review of Systems   Review of Systems  Constitutional: Negative for fever.  HENT: Negative for sore throat.   Respiratory: Negative for shortness of breath.   Cardiovascular: Negative for chest pain.  Gastrointestinal: Negative for abdominal pain.  Genitourinary: Negative for flank pain.  Musculoskeletal: Positive for back pain.  Neurological: Negative for light-headedness and headaches.  All other systems reviewed and are negative.   Physical Exam Updated Vital Signs BP 123/86   Pulse Marland Kitchen)  104   Temp 97.8 F (36.6 C) (Oral)   Resp 18   LMP 02/21/2020   SpO2 100%   Physical Exam Vitals and nursing note reviewed.  Constitutional:      Appearance: Normal appearance.  HENT:     Head: Normocephalic and atraumatic.     Mouth/Throat:     Mouth: Mucous membranes are moist.  Eyes:     Pupils: Pupils are equal, round, and reactive to light.  Cardiovascular:     Rate and Rhythm: Normal rate.  Pulmonary:     Effort: Pulmonary effort is normal.  Abdominal:     General: Abdomen is flat.     Tenderness: There is no abdominal tenderness.  Musculoskeletal:     Cervical back: Normal range of motion and neck supple.  Skin:    General: Skin is warm and dry.  Neurological:     Mental  Status: She is alert and oriented to person, place, and time.     ED Results / Procedures / Treatments   Labs (all labs ordered are listed, but only abnormal results are displayed) Labs Reviewed - No data to display  EKG None  Radiology No results found.  Procedures Procedures (including critical care time)  Medications Ordered in ED Medications - No data to display  ED Course  I have reviewed the triage vital signs and the nursing notes.  Pertinent labs & imaging results that were available during my care of the patient were reviewed by me and considered in my medical decision making (see chart for details).    MDM Rules/Calculators/A&P     Patient presents to the ED with complaints of lower back pain, originally seen on February 17, 2020.  Patient was sent home with Flexeril along with Lidoderm patches.  She reports she has been using this therapy however reports her pain has not improved.  She is requesting an EKG as she states "I want to try did not have a stroke ".  She is alert and oriented x4, was ambulatory in the ED with a cane.  He does report having physical therapy in the past to help with her back pain she was in ER.  She also reports she is having some right knee pain, was getting injections by her orthopedist but has not seen him insert amount of time.  Patient is unaware whether she is diabetic or not, unable to provide her with steroid therapy today to help with her symptoms.  We discussed appropriateness of EKG testing versus x-ray.  She does not have any traumas, bowel or bladder complaints, fevers vitals are within normal limits. Is overall nontoxic, non-ill-appearing.  Does have an appointment with PCP on 11 April.  I have extended her Flexeril prescription as this is helping with her symptoms.  She is to follow-up with orthopedist as well.  Return precautions discussed at length.   Portions of this note were generated with Scientist, clinical (histocompatibility and immunogenetics). Dictation  errors may occur despite best attempts at proofreading.  Final Clinical Impression(s) / ED Diagnoses Final diagnoses:  Acute left-sided low back pain without sciatica    Rx / DC Orders ED Discharge Orders         Ordered    cyclobenzaprine (FLEXERIL) 10 MG tablet  2 times daily PRN     02/22/20 1906           Claude Manges, PA-C 02/22/20 1913    Wynetta Fines, MD 02/24/20 [phone]

## 2020-03-21 ENCOUNTER — Other Ambulatory Visit: Payer: Medicaid Other

## 2020-05-11 ENCOUNTER — Other Ambulatory Visit: Payer: Self-pay

## 2020-05-11 ENCOUNTER — Ambulatory Visit: Payer: Medicaid Other | Attending: Sports Medicine | Admitting: Rehabilitative and Restorative Service Providers"

## 2020-05-11 ENCOUNTER — Encounter: Payer: Self-pay | Admitting: Rehabilitative and Restorative Service Providers"

## 2020-05-11 DIAGNOSIS — M6281 Muscle weakness (generalized): Secondary | ICD-10-CM | POA: Diagnosis present

## 2020-05-11 DIAGNOSIS — M545 Low back pain, unspecified: Secondary | ICD-10-CM

## 2020-05-11 DIAGNOSIS — G8929 Other chronic pain: Secondary | ICD-10-CM | POA: Insufficient documentation

## 2020-05-11 NOTE — Therapy (Signed)
[place], [place], [address] Phone: [phone]   Fax:  [phone]  Physical Therapy Evaluation  Patient Details  Name: Robin Wright MRN: [id number] Date of Birth: 12-25-1975 Referring Provider (PT): Tilda Franco Date: 05/11/2020   PT End of Session - 05/11/20 1407    Visit Number 1    Number of Visits 12    Date for PT Re-Evaluation 06/22/20    Authorization Type MCD    PT Start Time 0207    PT Stop Time 0308    PT Time Calculation (min) 61 min    Activity Tolerance Patient tolerated treatment well;No increased pain    Behavior During Therapy [facility] for tasks assessed/performed           History reviewed. No pertinent past medical history.  Past Surgical History:  Procedure Laterality Date  . c-section      There were no vitals filed for this visit.    Subjective Assessment - 05/11/20 1418    Subjective uses cane; My back hurts me. The numbness just got better in my right foot. Has received 2 back and 2 knee injections    Pertinent History Pt has extensive back pain history beginning in 2010. Pt reports R UE/LE radicular symptoms; denies headaches from 2020. no spinal surgery.    Limitations Sitting;House hold activities;Standing;Walking    How long can you sit comfortably? 30 min to 1 hour prior to pain    How long can you stand comfortably? unsure    How long can you walk comfortably? 30 min with support    Diagnostic tests MRI scheduled for 05/17/2020 for lumbar spine; had brain CT scan several years ago in [place]    Patient Stated Goals to be less painful    Currently in Pain? Yes    Pain Score 6     Pain Location Back    Pain Orientation Right;Left    Pain Descriptors / Indicators Numbness;Penetrating;Sharp;Tightness    Pain Type Chronic pain    Pain Radiating Towards R thigh    Pain Onset More than a month ago    Pain Frequency Constant    Aggravating Factors  driving; sitting      Pain Relieving Factors medicine, heating pad, bike, theraband back extension    Effect of Pain on Daily Activities limits activities with modifications    Multiple Pain Sites No              [facility] PT Assessment - 05/11/20 0001      Assessment   Medical Diagnosis Bil Low DJD    Referring Provider (PT) Alfonso Ramus    Onset Date/Surgical Date --   02/17/2020   Hand Dominance Left    Next MD Visit TBD    Prior Therapy 2017      Precautions   Precautions None      Restrictions   Weight Bearing Restrictions No      Balance Screen   Has the patient fallen in the past 6 months Yes    How many times? 1   slipped on a paper bag   Has the patient had a decrease in activity level because of a fear of falling?  Yes    Is the patient reluctant to leave their home because of a fear of falling?  Yes   social anxiety     Home Social worker Private residence    Additional Comments cares for Mom and  teenage daughter      Prior Function   Level of Independence Independent with basic ADLs      Cognition   Overall Cognitive Status Within Functional Limits for tasks assessed      Observation/Other Assessments   Observations restless    Other Surveys  --   Oswestry 35/50     Sensation   Light Touch Impaired by gross assessment      Coordination   Gross Motor Movements are Fluid and Coordinated Yes      Posture/Postural Control   Posture Comments R thoracolumbar paraspinals tighter, L glute tighter      ROM / Strength   AROM / PROM / Strength AROM;PROM;Strength      AROM   Overall AROM Comments Lumbar AROM WNL without pain      PROM   Overall PROM Comments Hip PROM WNL and = bil but with complaints of R lumbar pulling with R hip flexion      Strength   Overall Strength Comments R hip flexion/ext 3-/5; all others bil LEs 4+/5 major muscle groups      Palpation   SI assessment  tender along sacral border    Palpation comment R thoracolumbar paraspinals  tighter, L glute tighter; pt tender to spinal mobilizations with emphasis on L2-4. Unable to tolerate full mobilization assessment; no tenderness to palpation to R glute      Special Tests   Other special tests Slump test - R, SLR - R, Repeated Flex/ext - for reproducing radicular sxs; however, pt does have an extension preference      Ambulation/Gait   Gait Comments uses a cane                      Objective measurements completed on examination: See above findings.               PT Education - 05/11/20 1519    Education Details Discussed with pt proper sleeping position and not to sleep with pillow under lumbar spine but instead to place under knees as related to anatomy; PT discussed placing between knees in sidelying    Person(s) Educated Patient    Methods Explanation    Comprehension Verbalized understanding            PT Short Term Goals - 05/11/20 1817      PT SHORT TERM GOAL #1   Title Patient will be independent with HEP    Baseline not issued    Time 3    Period Weeks    Status New    Target Date 06/01/20      PT SHORT TERM GOAL #2   Title Pt will report LBP/R LE pain improvement x 25% with functional mobility    Baseline unable    Time 3    Period Weeks    Status New    Target Date 06/01/20             PT Long Term Goals - 05/11/20 1819      PT LONG TERM GOAL #1   Title Pt will be able to drive with 16% less difficulty for positioning and usage of R LE    Baseline 25% improvement    Time 6    Period Weeks    Status New    Target Date 06/22/20      PT LONG TERM GOAL #2   Title Pt will demo improved R LE hip flexion/extension to >/= 4/5  to assist with transfers    Baseline 3-/5    Time 6    Period Weeks    Status New    Target Date 06/22/20      PT LONG TERM GOAL #3   Title Oswestry LBP disability index will be </= 25/50 for improved quality of life    Baseline 35/50    Time 6    Period Weeks    Status New     Target Date 06/22/20      PT LONG TERM GOAL #4   Title pt will be able to sit x 90 min to watch a movie with </= 4/10 LBP/R LE pain    Baseline 60 min    Time 6    Period Weeks    Target Date 06/22/20                  Plan - 05/11/20 1522    Clinical Impression Statement Pt presents to PT with complaints of bil LBP that limits her functional mobility and R LE thigh numbness extending to her R thigh. She reports her R foot used to be numb but sensation recently returned. Pt also reports she has radiating pain on the entire R side of her body and recently went to the ER thinking she had a stroke (see epic). PT asked if she had had a brain MRI as she has experienced R sided radiating pain for several years. ? alternate diagnosis. Pt exhibits full lumbar/hip AROM but does have weakness of R hip flexor/extensors per MMT. She has tenderness to L2-4 upon palpation and rates a 35/50 on the Oswestry scale for disability. Pt with tightness along her R thoracolumbar paraspinals and L glute. Pt would benefit from PT for lumbar/core/R hip strengthening, postural education, manual therapy to address pain and pain relief modalities as needed. Pt has an extension preference.    Examination-Activity Limitations Locomotion Level;Stand;Caring for Others;Transfers;Sit;Squat;Stairs    Examination-Participation Restrictions Shop;Cleaning;Community Activity;Driving    Stability/Clinical Decision Making Evolving/Moderate complexity    Clinical Decision Making Moderate    Rehab Potential Good    PT Frequency 2x / week    PT Duration 6 weeks    PT Treatment/Interventions ADLs/Self Care Home Management;Electrical Stimulation;Cryotherapy;Ultrasound;Moist Heat;Iontophoresis 4mg /ml Dexamethasone;Functional mobility training;Therapeutic exercise;Therapeutic activities;Patient/family education;Manual techniques;Passive range of motion;Dry needling;Taping    PT Next Visit Plan Issue HEP; ask her how sleeping with a  pillow under her knees is working, lumbar flexibility therex, issue marching HEP    Recommended Other Services Gave pt community resource phone numbers to call in case she needs assistance    Consulted and Agree with Plan of Care Patient           Patient will benefit from skilled therapeutic intervention in order to improve the following deficits and impairments:  Abnormal gait, Decreased mobility, Difficulty walking, Hypomobility, Increased muscle spasms, Improper body mechanics, Decreased range of motion, Decreased activity tolerance, Decreased strength, Impaired flexibility, Postural dysfunction, Pain  Visit Diagnosis: Chronic bilateral low back pain, unspecified whether sciatica present  Muscle weakness (generalized)     Problem List Patient Active Problem List   Diagnosis Date Noted  . Memory loss 06/04/2017  . Neck pain 06/04/2017    06/06/2017, PT 05/11/2020, 6:29 PM  [facility] [address] [place], [place], [place] Phone: [phone]   Fax:  [phone]  Name: Robin Wright MRN: [name] Date of Birth: 1976/03/26

## 2020-05-17 ENCOUNTER — Other Ambulatory Visit: Payer: Medicaid Other

## 2020-05-25 ENCOUNTER — Encounter: Payer: Self-pay | Admitting: Physical Therapy

## 2020-05-25 ENCOUNTER — Ambulatory Visit: Payer: Medicaid Other | Attending: Sports Medicine | Admitting: Physical Therapy

## 2020-05-25 ENCOUNTER — Other Ambulatory Visit: Payer: Self-pay

## 2020-05-25 DIAGNOSIS — M6281 Muscle weakness (generalized): Secondary | ICD-10-CM | POA: Insufficient documentation

## 2020-05-25 DIAGNOSIS — M545 Low back pain: Secondary | ICD-10-CM | POA: Diagnosis present

## 2020-05-25 DIAGNOSIS — G8929 Other chronic pain: Secondary | ICD-10-CM | POA: Diagnosis present

## 2020-05-25 NOTE — Therapy (Signed)
[facility] [address] [place], [place], [address] Phone: [phone]   Fax:  [phone]  Physical Therapy Treatment  Patient Details  Name: Robin Wright MRN: [id number] Date of Birth: [birth year] Referring Provider (PT): Leatrice Jewels Date: 05/25/2020   PT End of Session - 05/25/20 1321    Visit Number 2    Number of Visits 12    Date for PT Re-Evaluation 06/22/20    Authorization Type MCD    Authorization Time Period 05/20/20-06/02/20    Authorization - Visit Number 1    Authorization - Number of Visits 3    PT Start Time 1315    PT Stop Time 1400    PT Time Calculation (min) 45 min           History reviewed. No pertinent past medical history.  Past Surgical History:  Procedure Laterality Date  . c-section      There were no vitals filed for this visit.   Subjective Assessment - 05/25/20 1321    Subjective I always have a hard time in the morning, knees, elbows and back hurt and are stiffness. Mid low back was having a slight spasm while I as driving here.    Pertinent History Pt has extensive back pain history beginning in 2010. Pt reports R UE/LE radicular symptoms; denies headaches from 2020. no spinal surgery.    Currently in Pain? Yes    Pain Score 8     Pain Location Back    Pain Orientation Mid;Left;Right;Lower    Pain Descriptors / Indicators Tightness   stiffness   Pain Type Chronic pain                             [facility] Adult PT Treatment/Exercise - 05/25/20 0001      Lumbar Exercises: Stretches   Quadruped Mid Back Stretch Limitations modified childs pose seated rolling physioball forward and lateral    Piriformis Stretch Right;Left;3 reps   sheet assist    Piriformis Stretch Limitations 20 seconds       Lumbar Exercises: Supine   Pelvic Tilt 20 reps    Bent Knee Raise 20 reps    Bent Knee Raise Limitations with cues for abdominal draw in     Bridge 10 reps    Bridge  Limitations with initial PPT                   PT Education - 05/25/20 1417    Education Details HEP    Person(s) Educated Patient    Methods Explanation;Handout    Comprehension Verbalized understanding            PT Short Term Goals - 05/11/20 1817      PT SHORT TERM GOAL #1   Title Patient will be independent with HEP    Baseline not issued    Time 3    Period Weeks    Status New    Target Date 06/01/20      PT SHORT TERM GOAL #2   Title Pt will report LBP/R LE pain improvement x 25% with functional mobility    Baseline unable    Time 3    Period Weeks    Status New    Target Date 06/01/20             PT Long Term Goals - 05/11/20 1819      PT LONG TERM GOAL #  1   Title Pt will be able to drive with 27% less difficulty for positioning and usage of R LE    Baseline 25% improvement    Time 6    Period Weeks    Status New    Target Date 06/22/20      PT LONG TERM GOAL #2   Title Pt will demo improved R LE hip flexion/extension to >/= 4/5 to assist with transfers    Baseline 3-/5    Time 6    Period Weeks    Status New    Target Date 06/22/20      PT LONG TERM GOAL #3   Title Oswestry LBP disability index will be </= 25/50 for improved quality of life    Baseline 35/50    Time 6    Period Weeks    Status New    Target Date 06/22/20      PT LONG TERM GOAL #4   Title pt will be able to sit x 90 min to watch a movie with </= 4/10 LBP/R LE pain    Baseline 60 min    Time 6    Period Weeks    Target Date 06/22/20                 Plan - 05/25/20 1414    Clinical Impression Statement Session focused on establishing initial HEP. No c/o pain and able to follow cues to complete exercises correctly. Asked her to discontinue any HEP that became painful. Session well tolerated. Needs cues to avoid holding breath.    Examination-Activity Limitations Locomotion Level;Stand;Caring for Others;Transfers;Sit;Squat;Stairs     Examination-Participation Restrictions Shop;Cleaning;Community Activity;Driving    PT Next Visit Plan review and progress HEP PRN, ask how sleeping with pillow under her knee is working, lumbar flexibility therex, marching HEP.    PT Home Exercise Plan [email or web address]: seated childs pose with ball or table forward and lateral, supine pelvic tilits, supine marching, supine bridge with PPT, piriformis stretch    Consulted and Agree with Plan of Care Patient           Patient will benefit from skilled therapeutic intervention in order to improve the following deficits and impairments:  Abnormal gait, Decreased mobility, Difficulty walking, Hypomobility, Increased muscle spasms, Improper body mechanics, Decreased range of motion, Decreased activity tolerance, Decreased strength, Impaired flexibility, Postural dysfunction, Pain  Visit Diagnosis: Chronic bilateral low back pain, unspecified whether sciatica present  Muscle weakness (generalized)     Problem List Patient Active Problem List   Diagnosis Date Noted  . Memory loss 06/04/2017  . Neck pain 06/04/2017    Sherrie Mustache, PTA 05/25/2020, 2:20 PM  [facility] [address] [place], [place], [address] Phone: [phone]   Fax:  [phone]  Name: Robin Wright MRN: [id number] Date of Birth: [birth year]

## 2020-05-25 NOTE — Patient Instructions (Signed)
Access Code: [email or web address] URL: [email or web address] Date: 05/25/2020 Prepared by: Jannette Spanner  Exercises Seated Marjo Bicker pose with exercise ball, chair or table - 2 x daily - 7 x weekly - 1 sets - 10 reps Knee to opposite shoulder stretch - 2 x daily - 7 x weekly - 1 sets - 3 reps - 10-20 hold Pelvic tilt - 2 x daily - 7 x weekly - 2 sets - 10 reps - 5 hold Supine March - 2 x daily - 7 x weekly - 2 sets - 10 reps Bridge - 2 x daily - 7 x weekly - 2 sets - 10 reps

## 2020-05-26 ENCOUNTER — Ambulatory Visit
Admission: RE | Admit: 2020-05-26 | Discharge: 2020-05-26 | Disposition: A | Discharge status: Home / Self Care | Payer: Medicaid Other | Source: Ambulatory Visit | Attending: Sports Medicine | Admitting: Sports Medicine

## 2020-05-26 DIAGNOSIS — M545 Low back pain, unspecified: Secondary | ICD-10-CM

## 2020-06-01 ENCOUNTER — Encounter: Payer: Self-pay | Admitting: Rehabilitative and Restorative Service Providers"

## 2020-06-01 ENCOUNTER — Other Ambulatory Visit: Payer: Self-pay

## 2020-06-01 ENCOUNTER — Ambulatory Visit: Payer: Medicaid Other | Admitting: Rehabilitative and Restorative Service Providers"

## 2020-06-01 DIAGNOSIS — M6281 Muscle weakness (generalized): Secondary | ICD-10-CM

## 2020-06-01 DIAGNOSIS — G8929 Other chronic pain: Secondary | ICD-10-CM

## 2020-06-01 DIAGNOSIS — M545 Low back pain: Secondary | ICD-10-CM | POA: Diagnosis not present

## 2020-06-01 NOTE — Therapy (Signed)
[facility] [address] [place], [place], [address] Phone: [phone]   Fax:  [phone]  Physical Therapy Treatment  Patient Details  Name: Devyn Griffing MRN: [id number] Date of Birth: 1976-10-17 Referring Provider (PT): Leatrice Jewels Date: 06/01/2020   PT End of Session - 06/01/20 1724    Visit Number 3    Number of Visits 12    Date for PT Re-Evaluation 06/22/20    Authorization Type MCD    Authorization Time Period 05/20/20-06/02/20    Authorization - Visit Number 2    Authorization - Number of Visits 3    PT Start Time 0459    PT Stop Time 0557    PT Time Calculation (min) 58 min    Activity Tolerance Patient tolerated treatment well;No increased pain    Behavior During Therapy [facility] for tasks assessed/performed           History reviewed. No pertinent past medical history.  Past Surgical History:  Procedure Laterality Date  . c-section      There were no vitals filed for this visit.   Subjective Assessment - 06/01/20 1701    Subjective Overall about an 8/10    How long can you sit comfortably? 30 min to 1 hour prior to pain    How long can you stand comfortably? unsure    How long can you walk comfortably? 30 min with support    Patient Stated Goals to be less painful    Currently in Pain? Yes    Pain Score 8     Pain Location Back    Pain Orientation Mid;Lower;Left;Right    Pain Descriptors / Indicators Tightness    Pain Type Chronic pain    Pain Onset More than a month ago    Pain Frequency Constant    Multiple Pain Sites No                             [facility] Adult PT Treatment/Exercise - 06/01/20 0001      Lumbar Exercises: Supine   Other Supine Lumbar Exercises tilt x 20 with max PT verbal and tactile cues for proper muscle facilitation; tilt with heel slide x 10; lower trunk rotation 5x15 sec; tilt with shoulder flex/ext x 20; tilt with SLR x 15; tilt with hip abdct/addction  combo x 15, bridge with tilt x 20; lower trunk rotation 3x15 sec; tilt with bil SAQ x 20      Modalities   Modalities Electrical Stimulation      Electrical Stimulation   Electrical Stimulation Location R lumbar paraspinals    Electrical Stimulation Action x 12 min sitting    Electrical Stimulation Parameters IFC    Electrical Stimulation Goals Pain                    PT Short Term Goals - 06/01/20 1728      PT SHORT TERM GOAL #1   Title Patient will be independent with HEP    Time 3    Period Weeks    Status On-going    Target Date 06/01/20      PT SHORT TERM GOAL #2   Title Pt will report LBP/R LE pain improvement x 25% with functional mobility    Time 3    Period Weeks    Status On-going    Target Date 06/01/20  PT Long Term Goals - 05/11/20 1819      PT LONG TERM GOAL #1   Title Pt will be able to drive with 23% less difficulty for positioning and usage of R LE    Baseline 25% improvement    Time 6    Period Weeks    Status New    Target Date 06/22/20      PT LONG TERM GOAL #2   Title Pt will demo improved R LE hip flexion/extension to >/= 4/5 to assist with transfers    Baseline 3-/5    Time 6    Period Weeks    Status New    Target Date 06/22/20      PT LONG TERM GOAL #3   Title Oswestry LBP disability index will be </= 25/50 for improved quality of life    Baseline 35/50    Time 6    Period Weeks    Status New    Target Date 06/22/20      PT LONG TERM GOAL #4   Title pt will be able to sit x 90 min to watch a movie with </= 4/10 LBP/R LE pain    Baseline 60 min    Time 6    Period Weeks    Target Date 06/22/20                 Plan - 06/01/20 1725    Clinical Impression Statement Pt presents to PT with continued LBP and bil LE weakness which affects walking and function. Pt would benefit from further PT to address above said deficits. She is motivated to get better and improve her quality of life. Checked with  front desk and no approval for future visits yet; insurance still working on it.    Rehab Potential Good    PT Frequency 2x / week    PT Duration 6 weeks    PT Treatment/Interventions ADLs/Self Care Home Management;Electrical Stimulation;Cryotherapy;Ultrasound;Moist Heat;Iontophoresis 4mg /ml Dexamethasone;Functional mobility training;Therapeutic exercise;Therapeutic activities;Patient/family education;Manual techniques;Passive range of motion;Dry needling;Taping    PT Next Visit Plan review and progress HEP PRN, ask how sleeping with pillow under her knee is working (she needs to get more pillows), lumbar flexibility therex, marching HEP.see if more visits approved    Consulted and Agree with Plan of Care Patient           Patient will benefit from skilled therapeutic intervention in order to improve the following deficits and impairments:  Abnormal gait, Decreased mobility, Difficulty walking, Hypomobility, Increased muscle spasms, Improper body mechanics, Decreased range of motion, Decreased activity tolerance, Decreased strength, Impaired flexibility, Postural dysfunction, Pain  Visit Diagnosis: Chronic bilateral low back pain, unspecified whether sciatica present  Muscle weakness (generalized)     Problem List Patient Active Problem List   Diagnosis Date Noted  . Memory loss 06/04/2017  . Neck pain 06/04/2017    06/06/2017, PT 06/01/2020, 5:57 PM  [facility] [address] [place], [place], [place] Phone: [phone]   Fax:  [phone]  Name: Alauna Hayden MRN: [name] Date of Birth: [birth year]

## 2020-06-01 NOTE — Patient Instructions (Addendum)
MRI done 05/26/20 showed the following: L4-L5: Facet hypertrophy and mild disc bulging.  L5-S1:Broad left eccentric protrusion that is chronic. Mild facet hypertrophy. No neural impingement  IMPRESSION: Mild degenerative changes at L3-4 and below without progression from 2018. No neural compression.  Reviewed with patient  Reviewed HEP with pt able to perform all but needing cues for proper pelvic tilt  Discussed TENS unit issuance as pt was asking; gave her [facility] number

## 2020-06-09 ENCOUNTER — Other Ambulatory Visit: Payer: Self-pay

## 2020-06-09 ENCOUNTER — Encounter: Payer: Self-pay | Admitting: Rehabilitative and Restorative Service Providers"

## 2020-06-09 ENCOUNTER — Ambulatory Visit: Payer: Medicaid Other | Admitting: Rehabilitative and Restorative Service Providers"

## 2020-06-09 DIAGNOSIS — M545 Low back pain: Secondary | ICD-10-CM | POA: Diagnosis not present

## 2020-06-09 DIAGNOSIS — M6281 Muscle weakness (generalized): Secondary | ICD-10-CM

## 2020-06-09 DIAGNOSIS — G8929 Other chronic pain: Secondary | ICD-10-CM

## 2020-06-09 NOTE — Therapy (Signed)
[facility] Outpatient Rehabilitation [facility] [address] [place], [place], [address] Phone: [phone]   Fax:  [phone]  Physical Therapy Treatment  Patient Details  Name: Yeimi Debnam MRN: [id number] Date of Birth: 04/14/1976 Referring Provider (PT): Leatrice Jewels Date: 06/09/2020   PT End of Session - 06/09/20 1341    Visit Number 4    Number of Visits 12    Date for PT Re-Evaluation 06/22/20    Authorization Type MCD    PT Start Time 0130    PT Stop Time 0216    PT Time Calculation (min) 46 min    Activity Tolerance Patient tolerated treatment well;No increased pain    Behavior During Therapy [facility] for tasks assessed/performed           History reviewed. No pertinent past medical history.  Past Surgical History:  Procedure Laterality Date  . c-section      There were no vitals filed for this visit.   Subjective Assessment - 06/09/20 1339    Subjective Gonna order me a TENS unit and bolster becaue both help.    Currently in Pain? Yes    Pain Score 6     Pain Location Back    Pain Orientation Mid;Lower;Left;Right    Pain Descriptors / Indicators Tightness;Sharp;Spasm    Pain Type Chronic pain    Pain Onset More than a month ago    Multiple Pain Sites No                             [facility] Adult PT Treatment/Exercise - 06/09/20 0001      Lumbar Exercises: Supine   Other Supine Lumbar Exercises while on moist heat and with IFC: tilt x 20; tilt with SAQ x 20; tilt with glute set and bridge x 20; tilt with SLR x 15 unilat bil; tilt with ball squeeze x 20; tilt with ball squeeze and bridge x 20; oblique reach with tilt x 15; tilt with UE shoulder flex/ext x 20; tilt with glute set and hamstring dig x 15      Modalities   Modalities Electrical Stimulation;Moist Heat      Moist Heat Therapy   Number Minutes Moist Heat 20 Minutes    Moist Heat Location Lumbar Spine      Electrical Stimulation   Electrical Stimulation  Location Bil lumbar paraspinals    Electrical Stimulation Action x 20 min supine on heat    Electrical Stimulation Parameters IFC    Electrical Stimulation Goals Pain                    PT Short Term Goals - 06/09/20 1344      PT SHORT TERM GOAL #1   Title Patient will be independent with HEP    Time 3    Period Weeks    Status On-going      PT SHORT TERM GOAL #2   Title Pt will report LBP/R LE pain improvement x 25% with functional mobility    Time 3    Period Weeks    Status On-going             PT Long Term Goals - 05/11/20 1819      PT LONG TERM GOAL #1   Title Pt will be able to drive with 32% less difficulty for positioning and usage of R LE    Baseline 25% improvement    Time 6  Period Weeks    Status New    Target Date 06/22/20      PT LONG TERM GOAL #2   Title Pt will demo improved R LE hip flexion/extension to >/= 4/5 to assist with transfers    Baseline 3-/5    Time 6    Period Weeks    Status New    Target Date 06/22/20      PT LONG TERM GOAL #3   Title Oswestry LBP disability index will be </= 25/50 for improved quality of life    Baseline 35/50    Time 6    Period Weeks    Status New    Target Date 06/22/20      PT LONG TERM GOAL #4   Title pt will be able to sit x 90 min to watch a movie with </= 4/10 LBP/R LE pain    Baseline 60 min    Time 6    Period Weeks    Target Date 06/22/20                 Plan - 06/09/20 1342    Clinical Impression Statement Pt reports pain to reside in buttocks and Low back on R side. Pt continues to report bil LE weakness. Pt would benefit from further PT to address above deficits. Pt would benefit from continued lumbar flexibility/strengthening, R glute and R LE pain management.    Rehab Potential Good    PT Frequency 2x / week    PT Duration 6 weeks    PT Treatment/Interventions ADLs/Self Care Home Management;Electrical Stimulation;Cryotherapy;Ultrasound;Moist Heat;Iontophoresis 4mg /ml  Dexamethasone;Functional mobility training;Therapeutic exercise;Therapeutic activities;Patient/family education;Manual techniques;Passive range of motion;Dry needling;Taping    PT Next Visit Plan progress HEP PRN, lumbar flexibility and strengthening TE, see if more visits approved yet. Checked for visits and staff said insurance is still working on it           Patient will benefit from skilled therapeutic intervention in order to improve the following deficits and impairments:  Abnormal gait, Decreased mobility, Difficulty walking, Hypomobility, Increased muscle spasms, Improper body mechanics, Decreased range of motion, Decreased activity tolerance, Decreased strength, Impaired flexibility, Postural dysfunction, Pain  Visit Diagnosis: Chronic bilateral low back pain, unspecified whether sciatica present  Muscle weakness (generalized)     Problem List Patient Active Problem List   Diagnosis Date Noted  . Memory loss 06/04/2017  . Neck pain 06/04/2017    06/06/2017, PT 06/09/2020, 2:11 PM  [facility] [address] [place], [place], [place] Phone: [phone]   Fax:  [phone]  Name: Melayah Skorupski MRN: [name] Date of Birth: June 02, 1976

## 2020-06-14 ENCOUNTER — Other Ambulatory Visit: Payer: Self-pay

## 2020-06-14 ENCOUNTER — Ambulatory Visit: Payer: Medicaid Other | Admitting: Physical Therapy

## 2020-06-14 ENCOUNTER — Ambulatory Visit: Payer: Medicaid Other

## 2020-06-14 DIAGNOSIS — G8929 Other chronic pain: Secondary | ICD-10-CM

## 2020-06-14 DIAGNOSIS — M6281 Muscle weakness (generalized): Secondary | ICD-10-CM

## 2020-06-14 DIAGNOSIS — M545 Low back pain: Secondary | ICD-10-CM | POA: Diagnosis not present

## 2020-06-14 NOTE — Therapy (Signed)
[facility] Outpatient Rehabilitation [facility] [address] [place], [place], [address] Phone: [phone]   Fax:  [phone]  Physical Therapy Treatment  Patient Details  Name: Robin Wright MRN: [id number] Date of Birth: 1976-02-18 Referring Provider (PT): Leatrice Jewels Date: 06/14/2020   PT End of Session - 06/14/20 1649    Visit Number 5    Number of Visits 12    Date for PT Re-Evaluation 06/22/20    Authorization Type MCD    PT Start Time 1622    PT Stop Time 1710    PT Time Calculation (min) 48 min    Activity Tolerance Patient tolerated treatment well;No increased pain    Behavior During Therapy [facility] for tasks assessed/performed           No past medical history on file.  Past Surgical History:  Procedure Laterality Date  . c-section      There were no vitals filed for this visit.   Subjective Assessment - 06/14/20 1639    Subjective Pt reports she wants a TENS unit, but doesn't want to order one. She hasn't been doing her exercises, only can do them in here, but wants to walk.    Currently in Pain? Yes    Pain Score 7     Pain Location Back    Pain Orientation Upper    Pain Descriptors / Indicators Aching    Pain Onset More than a month ago                             [facility] Adult PT Treatment/Exercise - 06/14/20 0001      Exercises   Exercises Lumbar      Lumbar Exercises: Aerobic   Nustep Lvl 7, 6.5 min      Lumbar Exercises: Seated   Sit to Stand 15 reps    Sit to Stand Limitations ESTIM      Lumbar Exercises: Supine   Pelvic Tilt 15 reps;5 seconds    Bridge 15 reps;5 seconds    Bridge Limitations cues for inner thigh squeeze    Other Supine Lumbar Exercises H/L ADD 15x5"    Other Supine Lumbar Exercises TrA hold with marching 2 x10 B      Lumbar Exercises: Prone   Other Prone Lumbar Exercises Prone knee bend with hips down x 8 each with ESTIM      Lumbar Exercises: Quadruped   Madcat/Old Horse 10  reps    Madcat/Old Horse Limitations with ESTIM    Other Quadruped Lumbar Exercises QP rocking x 10 with ESTIM      Electrical Stimulation   Electrical Stimulation Location B L5/S1 paraspinals    Electrical Stimulation Action x10 min with exercise    Electrical Stimulation Parameters IFC    Electrical Stimulation Goals Strength;Pain;Neuromuscular facilitation                    PT Short Term Goals - 06/09/20 1344      PT SHORT TERM GOAL #1   Title Patient will be independent with HEP    Time 3    Period Weeks    Status On-going      PT SHORT TERM GOAL #2   Title Pt will report LBP/R LE pain improvement x 25% with functional mobility    Time 3    Period Weeks    Status On-going  PT Long Term Goals - 05/11/20 1819      PT LONG TERM GOAL #1   Title Pt will be able to drive with 37% less difficulty for positioning and usage of R LE    Baseline 25% improvement    Time 6    Period Weeks    Status New    Target Date 06/22/20      PT LONG TERM GOAL #2   Title Pt will demo improved R LE hip flexion/extension to >/= 4/5 to assist with transfers    Baseline 3-/5    Time 6    Period Weeks    Status New    Target Date 06/22/20      PT LONG TERM GOAL #3   Title Oswestry LBP disability index will be </= 25/50 for improved quality of life    Baseline 35/50    Time 6    Period Weeks    Status New    Target Date 06/22/20      PT LONG TERM GOAL #4   Title pt will be able to sit x 90 min to watch a movie with </= 4/10 LBP/R LE pain    Baseline 60 min    Time 6    Period Weeks    Target Date 06/22/20                 Plan - 06/14/20 1650    Clinical Impression Statement Pt reports coming in with LBP today but it resolving after tx. She does note some B shoulder discomfort following QP rocking and cat/camel. Started with supine PPT exercises and marching today to facilitate core then progressed to moving exercises with ESTIM. Well tolerated tx  with no overt adverse reactions and pt reduction in pain following session.    Rehab Potential Good    PT Frequency 2x / week    PT Duration 6 weeks    PT Treatment/Interventions ADLs/Self Care Home Management;Electrical Stimulation;Cryotherapy;Ultrasound;Moist Heat;Iontophoresis 4mg /ml Dexamethasone;Functional mobility training;Therapeutic exercise;Therapeutic activities;Patient/family education;Manual techniques;Passive range of motion;Dry needling;Taping    PT Next Visit Plan Progress core stabilization, BLE strength and pain free mobility. Modalities PRN.           Patient will benefit from skilled therapeutic intervention in order to improve the following deficits and impairments:  Abnormal gait, Decreased mobility, Difficulty walking, Hypomobility, Increased muscle spasms, Improper body mechanics, Decreased range of motion, Decreased activity tolerance, Decreased strength, Impaired flexibility, Postural dysfunction, Pain  Visit Diagnosis: Chronic bilateral low back pain, unspecified whether sciatica present  Muscle weakness (generalized)     Problem List Patient Active Problem List   Diagnosis Date Noted  . Memory loss 06/04/2017  . Neck pain 06/04/2017    06/06/2017, PT, DPT 06/14/2020, 5:21 PM  [facility] [address] [place], [place], [place] Phone: [phone]   Fax:  [phone]  Name: Robin Wright MRN: [name] Date of Birth: 1976-02-23

## 2020-06-17 ENCOUNTER — Ambulatory Visit: Payer: Medicaid Other | Admitting: Physical Therapy

## 2020-06-20 ENCOUNTER — Ambulatory Visit: Payer: Medicaid Other | Admitting: Physical Therapy

## 2020-06-20 ENCOUNTER — Other Ambulatory Visit: Payer: Self-pay

## 2020-06-20 ENCOUNTER — Ambulatory Visit: Payer: Medicaid Other | Attending: Sports Medicine | Admitting: Physical Therapy

## 2020-06-20 DIAGNOSIS — M545 Low back pain: Secondary | ICD-10-CM | POA: Insufficient documentation

## 2020-06-20 DIAGNOSIS — G8929 Other chronic pain: Secondary | ICD-10-CM | POA: Insufficient documentation

## 2020-06-20 DIAGNOSIS — M6281 Muscle weakness (generalized): Secondary | ICD-10-CM | POA: Insufficient documentation

## 2020-06-20 NOTE — Therapy (Signed)
[facility] Outpatient Rehabilitation [place] [facility] [address] [place], [place], [address] Phone: [phone]   Fax:  [phone]  Physical Therapy Treatment  Patient Details  Name: Robin Wright MRN: [id number] Date of Birth: 23-Jul-1976 Referring Provider (PT): Leatrice Jewels Date: 06/20/2020   PT End of Session - 06/20/20 1332    Visit Number 6    Number of Visits 12    Date for PT Re-Evaluation 06/22/20    Authorization Type MCD - healthy blue    PT Start Time 1352    PT Stop Time 1440    PT Time Calculation (min) 48 min    Activity Tolerance Patient tolerated treatment well;No increased pain    Behavior During Therapy [facility] for tasks assessed/performed           No past medical history on file.  Past Surgical History:  Procedure Laterality Date  . c-section      There were no vitals filed for this visit.   Subjective Assessment - 06/20/20 1354    Subjective Pt states that she feels that there has been some improvement. Pt states she's been using the TENS unit and it helps. Pt reports her upper shoulders and upper back bother her first and then it goes down to her low back.    Pertinent History Pt has extensive back pain history beginning in 2010. Pt reports R UE/LE radicular symptoms; denies headaches from 2020. no spinal surgery.    Limitations Sitting;House hold activities;Standing;Walking    How long can you sit comfortably? 30 min to 1 hour prior to pain    How long can you stand comfortably? unsure    How long can you walk comfortably? 30 min with support    Diagnostic tests MRI scheduled for 05/17/2020 for lumbar spine; had brain CT scan several years ago in [place]    Patient Stated Goals to be less painful    Currently in Pain? Yes    Pain Score 7     Pain Location Back    Pain Orientation Upper    Pain Descriptors / Indicators Aching    Pain Type Chronic pain    Pain Onset More than a month ago                              [facility] Adult PT Treatment/Exercise - 06/20/20 0001      Lumbar Exercises: Stretches   Hip Flexor Stretch Right;Left;1 rep;30 seconds    Other Lumbar Stretch Exercise Open/close book x 10 bilat      Lumbar Exercises: Aerobic   Nustep L7 x 6 min      Lumbar Exercises: Supine   Pelvic Tilt 10 reps    Dead Bug 10 reps    Bridge 15 reps;3 seconds      Lumbar Exercises: Prone   Other Prone Lumbar Exercises I's, Y's, T's x 10 each    Other Prone Lumbar Exercises prone hip extension with knee bent x 10 bilat      Electrical Stimulation   Electrical Stimulation Location cervical to lumbar paraspinls    Electrical Stimulation Action IFC    Electrical Stimulation Parameters to pt tolerance    Electrical Stimulation Goals Pain      Manual Therapy   Manual Therapy Joint mobilization;Soft tissue mobilization    Joint Mobilization Grade II to III R hip mobs; PA thoracic mobs    Soft tissue mobilization STM upper trap  PT Short Term Goals - 06/09/20 1344      PT SHORT TERM GOAL #1   Title Patient will be independent with HEP    Time 3    Period Weeks    Status On-going      PT SHORT TERM GOAL #2   Title Pt will report LBP/R LE pain improvement x 25% with functional mobility    Time 3    Period Weeks    Status On-going             PT Long Term Goals - 05/11/20 1819      PT LONG TERM GOAL #1   Title Pt will be able to drive with 78% less difficulty for positioning and usage of R LE    Baseline 25% improvement    Time 6    Period Weeks    Status New    Target Date 06/22/20      PT LONG TERM GOAL #2   Title Pt will demo improved R LE hip flexion/extension to >/= 4/5 to assist with transfers    Baseline 3-/5    Time 6    Period Weeks    Status New    Target Date 06/22/20      PT LONG TERM GOAL #3   Title Oswestry LBP disability index will be </= 25/50 for improved quality of life    Baseline 35/50     Time 6    Period Weeks    Status New    Target Date 06/22/20      PT LONG TERM GOAL #4   Title pt will be able to sit x 90 min to watch a movie with </= 4/10 LBP/R LE pain    Baseline 60 min    Time 6    Period Weeks    Target Date 06/22/20                 Plan - 06/20/20 1434    Clinical Impression Statement Pt reports upper back pain -- addressed with manual therapy and provided thoracic strengthening exercises this session. Continued to progress pt's core and LE strengthening. Improved ROM for hip extension exercises after hip mobilization.    Examination-Activity Limitations Locomotion Level;Stand;Caring for Others;Transfers;Sit;Squat;Stairs    Examination-Participation Restrictions Shop;Cleaning;Community Activity;Driving    Rehab Potential Good    PT Frequency 2x / week    PT Duration 6 weeks    PT Treatment/Interventions ADLs/Self Care Home Management;Electrical Stimulation;Cryotherapy;Ultrasound;Moist Heat;Iontophoresis 4mg /ml Dexamethasone;Functional mobility training;Therapeutic exercise;Therapeutic activities;Patient/family education;Manual techniques;Passive range of motion;Dry needling;Taping    PT Next Visit Plan Re-eval next visit. Progress core stabilization, BLE strength and pain free mobility. Modalities PRN. Consider hip mobilizations and progressing upper back exercises.    PT Home Exercise Plan [id number]: added I's, Y's, T's and open book for thoracic spine           Patient will benefit from skilled therapeutic intervention in order to improve the following deficits and impairments:  Abnormal gait, Decreased mobility, Difficulty walking, Hypomobility, Increased muscle spasms, Improper body mechanics, Decreased range of motion, Decreased activity tolerance, Decreased strength, Impaired flexibility, Postural dysfunction, Pain  Visit Diagnosis: Chronic bilateral low back pain, unspecified whether sciatica present  Muscle weakness  (generalized)     Problem List Patient Active Problem List   Diagnosis Date Noted  . Memory loss 06/04/2017  . Neck pain 06/04/2017    Kirtis Challis April Ma L Saysha Menta PT, DPT 06/20/2020, 2:40 PM  [facility] [address] [place], [place], [address] Phone: [phone]   Fax:  [phone]  Name: Robin Wright MRN: [id number] Date of Birth: October 21, 1976

## 2020-06-22 ENCOUNTER — Ambulatory Visit: Payer: Medicaid Other | Admitting: Rehabilitative and Restorative Service Providers"

## 2020-06-22 ENCOUNTER — Other Ambulatory Visit: Payer: Self-pay

## 2020-06-22 ENCOUNTER — Encounter: Payer: Self-pay | Admitting: Rehabilitative and Restorative Service Providers"

## 2020-06-22 DIAGNOSIS — M6281 Muscle weakness (generalized): Secondary | ICD-10-CM

## 2020-06-22 DIAGNOSIS — M545 Low back pain: Secondary | ICD-10-CM | POA: Diagnosis not present

## 2020-06-22 DIAGNOSIS — G8929 Other chronic pain: Secondary | ICD-10-CM

## 2020-06-22 NOTE — Therapy (Signed)
[facility] Outpatient Rehabilitation [facility] [address] [place], [place], [address] Phone: [phone]   Fax:  [phone]  Physical Therapy Treatment/Re-eval  Patient Details  Name: Robin Wright MRN: [id number] Date of Birth: 1976/10/06 Referring Provider (PT): Leatrice Jewels Date: 06/22/2020   PT End of Session - 06/22/20 1653    Visit Number 7    Number of Visits 19    Date for PT Re-Evaluation 08/05/20    Authorization Type MCD - healthy blue    PT Start Time 0425    PT Stop Time 0513    PT Time Calculation (min) 48 min    Activity Tolerance Patient tolerated treatment well;No increased pain    Behavior During Therapy [facility] for tasks assessed/performed           History reviewed. No pertinent past medical history.  Past Surgical History:  Procedure Laterality Date  . c-section      There were no vitals filed for this visit.   Subjective Assessment - 06/22/20 1627    Subjective The stretches helped me last time. I am going to order the TENS today.    Currently in Pain? Yes    Pain Score 8     Pain Location Back    Pain Orientation Upper    Pain Descriptors / Indicators Aching    Pain Type Chronic pain    Pain Radiating Towards R thigh    Pain Onset More than a month ago    Multiple Pain Sites No                             [facility] Adult PT Treatment/Exercise - 06/22/20 0001      Lumbar Exercises: Supine   Other Supine Lumbar Exercises lower trunk rotation 3x15 sec each side; tilt with bridge x 20; iso tilt with bridge with bil UE flex/ext x 20      Lumbar Exercises: Prone   Other Prone Lumbar Exercises hamstring curls x 15 each side; donkey kick x 10 each side with pt difficulty llfting L LE; hip ext x 15 each side; prone heel squeeze x 20 with 2-3 sec hold      Lumbar Exercises: Quadruped   Other Quadruped Lumbar Exercises prayer stretch with limited ROM 3x15 sec      Electrical Stimulation   Electrical  Stimulation Location cervical to lumbar    Electrical Stimulation Action IFC x 20 min while doing prone exercise    Electrical Stimulation Parameters to pt tolereance    Electrical Stimulation Goals Pain      Manual Therapy   Manual therapy comments manual L hip flexor/Quad stretches in prone with and without oscillations; PA mobs to L hip; pt reports no pain with manual but localized tenderness after mobs                    PT Short Term Goals - 06/22/20 1657      PT SHORT TERM GOAL #1   Title Patient will be independent with HEP    Time 3    Period Weeks    Status Achieved      PT SHORT TERM GOAL #2   Title Pt will report LBP/R LE pain improvement x 25% with functional mobility    Baseline 10%    Time 3    Period Weeks    Status On-going    Target Date 07/13/20  PT Long Term Goals - 06/22/20 1658      PT LONG TERM GOAL #1   Title Pt will be able to drive with 72% less difficulty for positioning and usage of R LE    Baseline 35% improvement    Time 6    Period Weeks    Status On-going    Target Date 08/05/20      PT LONG TERM GOAL #2   Title Pt will demo improved R LE hip flexion/extension to >/= 4/5 to assist with transfers    Baseline flex 3+, ext 3-/5    Time 6    Period Weeks    Status On-going    Target Date 08/05/20      PT LONG TERM GOAL #3   Title Oswestry LBP disability index will be </= 25/50 for improved quality of life    Baseline 34/50    Time 6    Period Weeks    Status On-going    Target Date 08/05/20      PT LONG TERM GOAL #4   Title pt will be able to sit x 90 min to watch a movie with </= 4/10 LBP/R LE pain    Baseline 10 min with 8/10 pain    Time 6    Period Weeks    Status On-going    Target Date 08/05/20                 Plan - 06/22/20 1640    Clinical Impression Statement Pt reports absent upper back pain and improvement in R LE AROM after last session but with limitation of L LE hip extension.  Manual performed to address L hip limitation of hip extension with manual stretches performed. Continue to progress core and LE strengthening. Continue manual to improve range of motion. MCD visit request turned into insurance.    Rehab Potential Good    PT Frequency 2x / week    PT Duration 6 weeks    PT Treatment/Interventions ADLs/Self Care Home Management;Electrical Stimulation;Cryotherapy;Ultrasound;Moist Heat;Iontophoresis 4mg /ml Dexamethasone;Functional mobility training;Therapeutic exercise;Therapeutic activities;Patient/family education;Manual techniques;Passive range of motion;Dry needling;Taping    PT Next Visit Plan Re-eval next visit. Progress core stabilization, BLE strength and pain free mobility. Modalities PRN.  hip mobilizations and progressing upper back exercises.    Consulted and Agree with Plan of Care Patient           Patient will benefit from skilled therapeutic intervention in order to improve the following deficits and impairments:  Abnormal gait, Decreased mobility, Difficulty walking, Hypomobility, Increased muscle spasms, Improper body mechanics, Decreased range of motion, Decreased activity tolerance, Decreased strength, Impaired flexibility, Postural dysfunction, Pain  Visit Diagnosis: Chronic bilateral low back pain, unspecified whether sciatica present  Muscle weakness (generalized)     Problem List Patient Active Problem List   Diagnosis Date Noted  . Memory loss 06/04/2017  . Neck pain 06/04/2017    06/06/2017, PT 06/22/2020, 6:15 PM  [facility] [address] [place], [place], [place] Phone: [phone]   Fax:  [phone]  Name: Robin Wright MRN: [name] Date of Birth: 07-12-1976

## 2020-06-27 ENCOUNTER — Ambulatory Visit: Payer: Medicaid Other | Admitting: Physical Therapy

## 2020-06-27 ENCOUNTER — Encounter: Payer: Self-pay | Admitting: Physical Therapy

## 2020-06-27 ENCOUNTER — Other Ambulatory Visit: Payer: Self-pay

## 2020-06-27 DIAGNOSIS — M6281 Muscle weakness (generalized): Secondary | ICD-10-CM

## 2020-06-27 DIAGNOSIS — M545 Low back pain, unspecified: Secondary | ICD-10-CM

## 2020-06-27 NOTE — Therapy (Signed)
[facility] Outpatient Rehabilitation [facility] [address] [place], [place], [address] Phone: [phone]   Fax:  [phone]  Physical Therapy Treatment  Patient Details  Name: Robin Wright MRN: [id number] Date of Birth: [birth year] Referring Provider (PT): Leatrice Jewels Date: 06/27/2020   PT End of Session - 06/27/20 1328    Visit Number 8    Number of Visits 19    Date for PT Re-Evaluation 08/05/20    Authorization Type MCD - healthy blue    PT Start Time 1232    PT Stop Time 1330    PT Time Calculation (min) 58 min           History reviewed. No pertinent past medical history.  Past Surgical History:  Procedure Laterality Date  . c-section      There were no vitals filed for this visit.   Subjective Assessment - 06/27/20 1241    Subjective Pain is about a 5-6/10. I noticed my stress level is related to my pain.    Currently in Pain? Yes    Pain Score 6     Pain Location Back   and right shoulder   Pain Orientation Lower    Pain Descriptors / Indicators Aching    Aggravating Factors  driving, sitting, first wake up, after  running errands    Pain Relieving Factors meds, heat,                             [facility] Adult PT Treatment/Exercise - 06/27/20 0001      Lumbar Exercises: Stretches   Lower Trunk Rotation 10 seconds      Lumbar Exercises: Aerobic   Nustep L7 x 7 min      Lumbar Exercises: Supine   Dead Bug 10 reps    Bridge 20 reps      Lumbar Exercises: Prone   Other Prone Lumbar Exercises I's, Y's, T's x 10 each    Other Prone Lumbar Exercises hamstring curls and hip extensions with Tra contract       Lumbar Exercises: Quadruped   Madcat/Old Horse 10 reps    Straight Leg Raises Limitations alternating hip extension x 8 with cues for abdominal draw  in     Other Quadruped Lumbar Exercises prayer stretch /Qped rocking       Electrical Stimulation   Electrical Stimulation Location cervical to lumbar     Electrical Stimulation Action IFC x 15 minutes     Electrical Stimulation Parameters to pt tolerance     Electrical Stimulation Goals Pain      Manual Therapy   Manual therapy comments gentle upper trap STW- does not tolerate                     PT Short Term Goals - 06/22/20 1657      PT SHORT TERM GOAL #1   Title Patient will be independent with HEP    Time 3    Period Weeks    Status Achieved      PT SHORT TERM GOAL #2   Title Pt will report LBP/R LE pain improvement x 25% with functional mobility    Baseline 10%    Time 3    Period Weeks    Status On-going    Target Date 07/13/20             PT Long Term Goals - 06/22/20 1658  PT LONG TERM GOAL #1   Title Pt will be able to drive with 57% less difficulty for positioning and usage of R LE    Baseline 35% improvement    Time 6    Period Weeks    Status On-going    Target Date 08/05/20      PT LONG TERM GOAL #2   Title Pt will demo improved R LE hip flexion/extension to >/= 4/5 to assist with transfers    Baseline flex 3+, ext 3-/5    Time 6    Period Weeks    Status On-going    Target Date 08/05/20      PT LONG TERM GOAL #3   Title Oswestry LBP disability index will be </= 25/50 for improved quality of life    Baseline 34/50    Time 6    Period Weeks    Status On-going    Target Date 08/05/20      PT LONG TERM GOAL #4   Title pt will be able to sit x 90 min to watch a movie with </= 4/10 LBP/R LE pain    Baseline 10 min with 8/10 pain    Time 6    Period Weeks    Status On-going    Target Date 08/05/20                 Plan - 06/27/20 1243    Clinical Impression Statement Pt reports a litle improvement in driving tolerance. She reports stress is likely contributing to her pain. Continued with core strength and trunk/hip stretching. Good tolerance to therex however decreased tolerance to light pressure soft tissue work.    PT Treatment/Interventions ADLs/Self Care Home  Management;Electrical Stimulation;Cryotherapy;Ultrasound;Moist Heat;Iontophoresis 4mg /ml Dexamethasone;Functional mobility training;Therapeutic exercise;Therapeutic activities;Patient/family education;Manual techniques;Passive range of motion;Dry needling;Taping    PT Next Visit Plan Re-eval next visit. Progress core stabilization, BLE strength and pain free mobility. Modalities PRN.  hip mobilizations and progressing upper back exercises.    PT Home Exercise Plan [id number]: added I's, Y's, T's and open book for thoracic spine           Patient will benefit from skilled therapeutic intervention in order to improve the following deficits and impairments:  Abnormal gait, Decreased mobility, Difficulty walking, Hypomobility, Increased muscle spasms, Improper body mechanics, Decreased range of motion, Decreased activity tolerance, Decreased strength, Impaired flexibility, Postural dysfunction, Pain  Visit Diagnosis: Chronic bilateral low back pain, unspecified whether sciatica present  Muscle weakness (generalized)     Problem List Patient Active Problem List   Diagnosis Date Noted  . Memory loss 06/04/2017  . Neck pain 06/04/2017    06/06/2017, PTA 06/27/2020, 1:28 PM  [facility] [address] [place], [place], [place] Phone: [phone]   Fax:  [phone]  Name: Robin Wright MRN: [name] Date of Birth: 1975/11/22

## 2020-07-07 ENCOUNTER — Ambulatory Visit: Payer: Medicaid Other | Admitting: Physical Therapy

## 2020-07-12 ENCOUNTER — Other Ambulatory Visit: Payer: Self-pay

## 2020-07-12 ENCOUNTER — Ambulatory Visit: Payer: Medicaid Other | Admitting: Physical Therapy

## 2020-07-12 DIAGNOSIS — M545 Low back pain: Secondary | ICD-10-CM | POA: Diagnosis not present

## 2020-07-12 DIAGNOSIS — M6281 Muscle weakness (generalized): Secondary | ICD-10-CM

## 2020-07-12 DIAGNOSIS — G8929 Other chronic pain: Secondary | ICD-10-CM

## 2020-07-12 NOTE — Therapy (Addendum)
[facility] Outpatient Rehabilitation [facility] [address] [place], [place], [address] Phone: [phone]   Fax:  [phone]  Physical Therapy Treatment and Re-Certification  Patient Details  Name: Robin Wright MRN: [id number] Date of Birth: 1976-01-14 Referring Provider (PT): Leatrice Jewels Date: 07/12/2020   PT End of Session - 07/12/20 1603    Visit Number 9    Number of Visits 19    Date for PT Re-Evaluation 08/05/20    Authorization Type MCD - healthy blue    PT Start Time 1530    PT Stop Time 1625    PT Time Calculation (min) 55 min    Activity Tolerance Patient tolerated treatment well;No increased pain    Behavior During Therapy [facility] for tasks assessed/performed           No past medical history on file.  Past Surgical History:  Procedure Laterality Date  . c-section      There were no vitals filed for this visit.   Subjective Assessment - 07/12/20 1535    Subjective Pt states she's been walking more without the cane. Pt states she can feel that she's slowly improving.    Pertinent History Pt has extensive back pain history beginning in 2010. Pt reports R UE/LE radicular symptoms; denies headaches from 2020. no spinal surgery.    Limitations Sitting;House hold activities;Standing;Walking    How long can you sit comfortably? 30 min to 1 hour prior to pain    How long can you stand comfortably? unsure    How long can you walk comfortably? 30 min with support    Diagnostic tests MRI scheduled for 05/17/2020 for lumbar spine; had brain CT scan several years ago in [place]    Patient Stated Goals to be less painful    Currently in Pain? Yes    Pain Score 6     Pain Location Back    Pain Orientation Lower    Pain Descriptors / Indicators Aching    Pain Type Acute pain                             [facility] Adult PT Treatment/Exercise - 07/12/20 0001      Lumbar Exercises: Stretches   Single Knee to Chest Stretch Right;Left;30  seconds    Lower Trunk Rotation 10 seconds    Other Lumbar Stretch Exercise Lumbar twist 5 x 10 sec      Lumbar Exercises: Aerobic   Nustep L5 x 5 min      Lumbar Exercises: Supine   Bridge 5 reps    Other Supine Lumbar Exercises pball under ankle & roll out x10    Other Supine Lumbar Exercises TrA hold with marching 2 x10 B      Lumbar Exercises: Prone   Opposite Arm/Leg Raise Right arm/Left leg;Left arm/Right leg;20 reps      Electrical Stimulation   Electrical Stimulation Location lumbar    Electrical Stimulation Action IFC x 10 minutes    Electrical Stimulation Parameters to pt tolerance    Electrical Stimulation Goals Pain      Manual Therapy   Manual Therapy Joint mobilization;Soft tissue mobilization                    PT Short Term Goals - 06/22/20 1657      PT SHORT TERM GOAL #1   Title Patient will be independent with HEP    Time 3  Period Weeks    Status Achieved      PT SHORT TERM GOAL #2   Title Pt will report LBP/R LE pain improvement x 25% with functional mobility    Baseline 10%    Time 3    Period Weeks    Status On-going    Target Date 07/13/20             PT Long Term Goals - 06/22/20 1658      PT LONG TERM GOAL #1   Title Pt will be able to drive with 13% less difficulty for positioning and usage of R LE    Baseline 35% improvement    Time 6    Period Weeks    Status On-going    Target Date 08/05/20      PT LONG TERM GOAL #2   Title Pt will demo improved R LE hip flexion/extension to >/= 4/5 to assist with transfers    Baseline flex 3+, ext 3-/5    Time 6    Period Weeks    Status On-going    Target Date 08/05/20      PT LONG TERM GOAL #3   Title Oswestry LBP disability index will be </= 25/50 for improved quality of life    Baseline 34/50    Time 6    Period Weeks    Status On-going    Target Date 08/05/20      PT LONG TERM GOAL #4   Title pt will be able to sit x 90 min to watch a movie with </= 4/10 LBP/R LE  pain    Baseline 10 min with 8/10 pain    Time 6    Period Weeks    Status On-going    Target Date 08/05/20                 Plan - 07/12/20 1605    Clinical Impression Statement Treatment focused on stretching and joint mobilizations to improve pt's pain/stiffness. Continued core strengthening and thoracic/lumbar paraspinal strengthening. Pt tolerated treatment well. Pt is continuing to make gains towards goals. At this point, pt is able to tolerate increased sitting without pain but mostly feels stiffness. Pt with improving bilat LE strength and endurance as well. Pt has been relying less on her cane with ambulation. Pt would benefit from continued therapy to meet goals.    Examination-Activity Limitations Locomotion Level;Stand;Caring for Others;Transfers;Sit;Squat;Stairs    Examination-Participation Restrictions Shop;Cleaning;Community Activity;Driving    PT Treatment/Interventions ADLs/Self Care Home Management;Electrical Stimulation;Cryotherapy;Ultrasound;Moist Heat;Iontophoresis 4mg /ml Dexamethasone;Functional mobility training;Therapeutic exercise;Therapeutic activities;Patient/family education;Manual techniques;Passive range of motion;Dry needling;Taping    PT Next Visit Plan Progress core stabilization, BLE strength and pain free mobility. Modalities PRN.  hip mobilizations and progressing upper back exercises.    PT Home Exercise Plan [id number]: continue I's, Y's, T's and open book for thoracic spine.    Consulted and Agree with Plan of Care Patient            Patient will benefit from skilled therapeutic intervention in order to improve the following deficits and impairments:  Abnormal gait, Decreased mobility, Difficulty walking, Hypomobility, Increased muscle spasms, Improper body mechanics, Decreased range of motion, Decreased activity tolerance, Decreased strength, Impaired flexibility, Postural dysfunction, Pain  Visit Diagnosis: No diagnosis found.     Problem  List Patient Active Problem List   Diagnosis Date Noted  . Memory loss 06/04/2017  . Neck pain 06/04/2017    Abran Gavigan April Ma L Joleen Stuckert PT, DPT 07/12/2020, 4:30  PM  [facility] Outpatient Rehabilitation [facility] [address] [place], [place], [address] Phone: [phone]   Fax:  [phone]  Name: Robin Wright MRN: [id number] Date of Birth: 01-Aug-1976

## 2020-07-13 NOTE — Addendum Note (Signed)
Addended by: Jules Husbands MARIE L on: 07/13/2020 01:43 PM   Modules accepted: Orders

## 2020-07-14 ENCOUNTER — Other Ambulatory Visit: Payer: Self-pay

## 2020-07-14 ENCOUNTER — Ambulatory Visit: Payer: Medicaid Other | Admitting: Physical Therapy

## 2020-07-14 DIAGNOSIS — M545 Low back pain, unspecified: Secondary | ICD-10-CM

## 2020-07-14 DIAGNOSIS — M6281 Muscle weakness (generalized): Secondary | ICD-10-CM

## 2020-07-14 DIAGNOSIS — G8929 Other chronic pain: Secondary | ICD-10-CM

## 2020-07-14 NOTE — Therapy (Signed)
[facility] Outpatient Rehabilitation [facility] [address] [place], [place], [address] Phone: [phone]   Fax:  [phone]  Physical Therapy Treatment  Patient Details  Name: Robin Wright MRN: [id number] Date of Birth: 1976/10/28 Referring Provider (PT): Leatrice Jewels Date: 07/14/2020   PT End of Session - 07/14/20 1531    Visit Number 10    Number of Visits 19    Date for PT Re-Evaluation 08/05/20    Authorization Type MCD - healthy blue    PT Start Time 1445    PT Stop Time 1530    PT Time Calculation (min) 45 min    Activity Tolerance Patient tolerated treatment well;No increased pain    Behavior During Therapy [facility] for tasks assessed/performed           No past medical history on file.  Past Surgical History:  Procedure Laterality Date   c-section      There were no vitals filed for this visit.   Subjective Assessment - 07/14/20 1449    Subjective Pt reports bilateral shoulder deltoid pain. She states that she did a shoulder press up exercise that seemed to help.    Pertinent History Pt has extensive back pain history beginning in 2010. Pt reports R UE/LE radicular symptoms; denies headaches from 2020. no spinal surgery.    Limitations Sitting;House hold activities;Standing;Walking    How long can you sit comfortably? 30 min to 1 hour prior to pain    How long can you stand comfortably? unsure    How long can you walk comfortably? 30 min with support    Diagnostic tests MRI scheduled for 05/17/2020 for lumbar spine; had brain CT scan several years ago in [place]    Patient Stated Goals to be less painful    Currently in Pain? Yes    Pain Score 6     Pain Location Back    Pain Orientation Lower    Pain Descriptors / Indicators Aching;Sharp    Pain Type Chronic pain                             [facility] Adult PT Treatment/Exercise - 07/14/20 0001      Lumbar Exercises: Stretches   Other Lumbar Stretch Exercise Standing L  stretch on counter 2x 30 sec, R & L side flexion 2x 30 sec    Other Lumbar Stretch Exercise hip flexor stretch against counter 2x 30 sec      Lumbar Exercises: Aerobic   Nustep L7 x 5 min      Lumbar Exercises: Standing   Shoulder Extension Strengthening;Both;Theraband    Theraband Level (Shoulder Extension) Level 4 (Blue)    Other Standing Lumbar Exercises attempted diagonals x5; however, pt felt in shoulder too much    Other Standing Lumbar Exercises 10# Palloff press 2x10; hip extension 2x 10 green tband; hip abduction 2x10 green tband, PPT aganst wall x 10                    PT Short Term Goals - 07/12/20 1713      PT SHORT TERM GOAL #1   Title Patient will be independent with HEP    Time 3    Period Weeks    Status Achieved      PT SHORT TERM GOAL #2   Title Pt will report LBP/R LE pain improvement x 25% with functional mobility    Baseline 10%  Time 3    Period Weeks    Status Achieved    Target Date 07/13/20             PT Long Term Goals - 07/12/20 1713      PT LONG TERM GOAL #1   Title Pt will be able to drive with 13% less difficulty for positioning and usage of R LE    Baseline 35% improvement    Time 6    Period Weeks    Status On-going      PT LONG TERM GOAL #2   Title Pt will demo improved R LE hip flexion/extension to >/= 4/5 to assist with transfers    Baseline flex 3+, ext 3/5    Time 6    Period Weeks    Status On-going      PT LONG TERM GOAL #3   Title Oswestry LBP disability index will be </= 25/50 for improved quality of life    Baseline 34/50    Time 6    Period Weeks    Status On-going      PT LONG TERM GOAL #4   Title pt will be able to sit x 90 min to watch a movie with </= 4/10 LBP/R LE pain    Baseline 10 min with 8/10 pain    Time 6    Period Weeks    Status On-going                 Plan - 07/14/20 1531    Clinical Impression Statement Pt tolerated treatment well. Pt with improving tolerance to standing  with less knee pain. Initiated standing core exercises and hip strengthening. Pt continues to improve with therapy.    Examination-Activity Limitations Locomotion Level;Stand;Caring for Others;Transfers;Sit;Squat;Stairs    Examination-Participation Restrictions Shop;Cleaning;Community Activity;Driving    PT Treatment/Interventions ADLs/Self Care Home Management;Electrical Stimulation;Cryotherapy;Ultrasound;Moist Heat;Iontophoresis 4mg /ml Dexamethasone;Functional mobility training;Therapeutic exercise;Therapeutic activities;Patient/family education;Manual techniques;Passive range of motion;Dry needling;Taping    PT Next Visit Plan Progress core stabilization, BLE strength and pain free mobility. Modalities PRN.  hip mobilizations and progressing upper back exercises.    PT Home Exercise Plan [id number]: standing palloff press, hip abduction/extension, shoulder row & extension    Consulted and Agree with Plan of Care Patient           Patient will benefit from skilled therapeutic intervention in order to improve the following deficits and impairments:  Abnormal gait, Decreased mobility, Difficulty walking, Hypomobility, Increased muscle spasms, Improper body mechanics, Decreased range of motion, Decreased activity tolerance, Decreased strength, Impaired flexibility, Postural dysfunction, Pain  Visit Diagnosis: Chronic bilateral low back pain, unspecified whether sciatica present  Muscle weakness (generalized)     Problem List Patient Active Problem List   Diagnosis Date Noted   Memory loss 06/04/2017   Neck pain 06/04/2017    Harlea Goetzinger April Dell Ponto PT, DPT 07/14/2020, 3:33 PM  [facility] [address] [place], [place], [address] Phone: [phone]   Fax:  [phone]  Name: Robin Wright MRN: [id number] Date of Birth: [birth year]

## 2020-07-19 ENCOUNTER — Ambulatory Visit: Payer: Medicaid Other | Admitting: Physical Therapy

## 2020-07-19 ENCOUNTER — Other Ambulatory Visit: Payer: Self-pay

## 2020-07-19 DIAGNOSIS — M545 Low back pain: Secondary | ICD-10-CM | POA: Diagnosis not present

## 2020-07-19 DIAGNOSIS — G8929 Other chronic pain: Secondary | ICD-10-CM

## 2020-07-19 DIAGNOSIS — M6281 Muscle weakness (generalized): Secondary | ICD-10-CM

## 2020-07-19 NOTE — Therapy (Signed)
[facility] Outpatient Rehabilitation [facility] [address] [place], [place], [address] Phone: [phone]   Fax:  [phone]  Physical Therapy Treatment  Patient Details  Name: Robin Wright MRN: [id number] Date of Birth: 08/18/1976 Referring Provider (PT): Leatrice Jewels Date: 07/19/2020   PT End of Session - 07/19/20 1611    Visit Number 11    Number of Visits 19    Date for PT Re-Evaluation 08/05/20    Authorization Type MCD - healthy blue    PT Start Time 1530    PT Stop Time 1608    PT Time Calculation (min) 38 min    Activity Tolerance Patient tolerated treatment well;No increased pain    Behavior During Therapy [facility] for tasks assessed/performed           No past medical history on file.  Past Surgical History:  Procedure Laterality Date  . c-section      There were no vitals filed for this visit.   Subjective Assessment - 07/19/20 1538    Subjective Pt reports increased anxiety today. She reports no back pain but just tightness.    Pertinent History Pt has extensive back pain history beginning in 2010. Pt reports R UE/LE radicular symptoms; denies headaches from 2020. no spinal surgery.    Limitations Sitting;House hold activities;Standing;Walking    How long can you sit comfortably? 30 min to 1 hour prior to pain    How long can you stand comfortably? unsure    How long can you walk comfortably? 30 min with support    Diagnostic tests MRI scheduled for 05/17/2020 for lumbar spine; had brain CT scan several years ago in [place]    Patient Stated Goals to be less painful    Currently in Pain? No/denies                             [facility] Adult PT Treatment/Exercise - 07/19/20 0001      Lumbar Exercises: Stretches   Passive Hamstring Stretch 2 reps;20 seconds    Other Lumbar Stretch Exercise in seated: trunk rotation 2x20 sec, trunk flexion 2 x20 sec, trunk extension 2 x20 sec, cat/camel 2x20 sec    Other Lumbar Stretch  Exercise hip flexor stretch in sitting x20 sec each, modified downward dog on chair 2x20 sec, modified warrior I x20 sec bilat      Lumbar Exercises: Aerobic   Nustep L6 x 10 min      Lumbar Exercises: Standing   Other Standing Lumbar Exercises modified plank 3x30 sec                  PT Education - 07/19/20 1610    Education Details Discussed aerobic exercise, chair yoga, meditation/breathing, and sleep hygiene for pain and anxiety management    Person(s) Educated Patient    Methods Explanation;Demonstration;Tactile cues;Verbal cues    Comprehension Verbalized understanding;Returned demonstration;Verbal cues required;Tactile cues required            PT Short Term Goals - 07/12/20 1713      PT SHORT TERM GOAL #1   Title Patient will be independent with HEP    Time 3    Period Weeks    Status Achieved      PT SHORT TERM GOAL #2   Title Pt will report LBP/R LE pain improvement x 25% with functional mobility    Baseline 10%    Time 3    Period  Weeks    Status Achieved    Target Date 07/13/20             PT Long Term Goals - 07/12/20 1713      PT LONG TERM GOAL #1   Title Pt will be able to drive with 78% less difficulty for positioning and usage of R LE    Baseline 35% improvement    Time 6    Period Weeks    Status On-going      PT LONG TERM GOAL #2   Title Pt will demo improved R LE hip flexion/extension to >/= 4/5 to assist with transfers    Baseline flex 3+, ext 3/5    Time 6    Period Weeks    Status On-going      PT LONG TERM GOAL #3   Title Oswestry LBP disability index will be </= 25/50 for improved quality of life    Baseline 34/50    Time 6    Period Weeks    Status On-going      PT LONG TERM GOAL #4   Title pt will be able to sit x 90 min to watch a movie with </= 4/10 LBP/R LE pain    Baseline 10 min with 8/10 pain    Time 6    Period Weeks    Status On-going                 Plan - 07/19/20 1612    Clinical Impression  Statement Pt presents to clinic with no report of pain but increased anxiety and back tightness. Session focused on gentle stretching, ROM, and aerobic exercise. Initiated planking this session. Discussed with pt importance of managing stress and anxiety so it does not affect her back and pain. Educated pt on self management techniques. Pt with overall improving pain.    Examination-Activity Limitations Locomotion Level;Stand;Caring for Others;Transfers;Sit;Squat;Stairs    Examination-Participation Restrictions Shop;Cleaning;Community Activity;Driving    PT Treatment/Interventions ADLs/Self Care Home Management;Electrical Stimulation;Cryotherapy;Ultrasound;Moist Heat;Iontophoresis 4mg /ml Dexamethasone;Functional mobility training;Therapeutic exercise;Therapeutic activities;Patient/family education;Manual techniques;Passive range of motion;Dry needling;Taping    PT Next Visit Plan Continue PNE as needed. Progress core stabilization, BLE strength and pain free mobility. Modalities PRN.  hip mobilizations and progressing upper back exercises.    PT Home Exercise Plan [id number]: standing palloff press, hip abduction/extension, shoulder row & extension    Consulted and Agree with Plan of Care Patient           Patient will benefit from skilled therapeutic intervention in order to improve the following deficits and impairments:  Abnormal gait, Decreased mobility, Difficulty walking, Hypomobility, Increased muscle spasms, Improper body mechanics, Decreased range of motion, Decreased activity tolerance, Decreased strength, Impaired flexibility, Postural dysfunction, Pain  Visit Diagnosis: Chronic bilateral low back pain, unspecified whether sciatica present  Muscle weakness (generalized)     Problem List Patient Active Problem List   Diagnosis Date Noted  . Memory loss 06/04/2017  . Neck pain 06/04/2017    [facility] [address] PT, DPT 07/19/2020, 4:17 PM  [facility] [address] [place], [place], [place] Phone: [phone]   Fax:  [phone]  Name: Rajean Desantiago MRN: [name] Date of Birth: Oct 23, 1976

## 2020-07-28 ENCOUNTER — Encounter: Payer: Self-pay | Admitting: Physical Therapy

## 2020-07-28 ENCOUNTER — Other Ambulatory Visit: Payer: Self-pay

## 2020-07-28 ENCOUNTER — Ambulatory Visit: Payer: Medicaid Other | Attending: Sports Medicine | Admitting: Physical Therapy

## 2020-07-28 DIAGNOSIS — G8929 Other chronic pain: Secondary | ICD-10-CM

## 2020-07-28 DIAGNOSIS — M545 Low back pain: Secondary | ICD-10-CM | POA: Insufficient documentation

## 2020-07-28 DIAGNOSIS — M6281 Muscle weakness (generalized): Secondary | ICD-10-CM | POA: Diagnosis present

## 2020-07-28 NOTE — Therapy (Signed)
[facility] [address] [place], [place], [address] Phone: [phone]   Fax:  [phone]  Physical Therapy Treatment  Patient Details  Name: Robin Wright MRN: [id number] Date of Birth: 04/03/1976 Referring Provider (PT): Leatrice Jewels Date: 07/28/2020   PT End of Session - 07/28/20 1318    Visit Number 12    Number of Visits 19    Date for PT Re-Evaluation 08/05/20    Authorization Type MCD - healthy blue; requested new auth    PT Start Time 1319    PT Stop Time 1400    PT Time Calculation (min) 41 min    Activity Tolerance Patient tolerated treatment well;No increased pain    Behavior During Therapy [facility] for tasks assessed/performed           History reviewed. No pertinent past medical history.  Past Surgical History:  Procedure Laterality Date  . c-section      There were no vitals filed for this visit.   Subjective Assessment - 07/28/20 1322    Subjective Pt states night time she is sleeping better. Pt states the mornings she still feels stiff. Once she gets moving she feels better. Pt reports she is able to perform longer walks with her rollator.    Pertinent History Pt has extensive back pain history beginning in 2010. Pt reports R UE/LE radicular symptoms; denies headaches from 2020. no spinal surgery.    Limitations Sitting;House hold activities;Standing;Walking    How long can you sit comfortably? 30 min to 1 hour prior to pain    How long can you stand comfortably? unsure    How long can you walk comfortably? 30 min with support    Diagnostic tests MRI scheduled for 05/17/2020 for lumbar spine; had brain CT scan several years ago in [place]    Patient Stated Goals to be less painful    Currently in Pain? Yes    Pain Score 5     Pain Location Back    Pain Orientation Lower    Pain Type Chronic pain    Pain Radiating Towards R thigh                             [facility] Adult PT  Treatment/Exercise - 07/28/20 0001      Lumbar Exercises: Stretches   Press Ups 10 reps      Lumbar Exercises: Aerobic   Nustep L6 x 5 min      Lumbar Exercises: Supine   Bridge 10 reps    Other Supine Lumbar Exercises pball under ankle & roll out x10    Other Supine Lumbar Exercises Marching from bolster 2x5       Lumbar Exercises: Sidelying   Clam Both;20 reps    Clam Limitations green tband      Lumbar Exercises: Prone   Straight Leg Raise 20 reps      Manual Therapy   Manual Therapy Joint mobilization;Soft tissue mobilization    Joint Mobilization lateral, inferior & anterior grade II to III hip mobs    Soft tissue mobilization STM                     PT Short Term Goals - 07/12/20 1713      PT SHORT TERM GOAL #1   Title Patient will be independent with HEP    Time 3    Period Weeks  Status Achieved      PT SHORT TERM GOAL #2   Title Pt will report LBP/R LE pain improvement x 25% with functional mobility    Baseline 10%    Time 3    Period Weeks    Status Achieved    Target Date 07/13/20             PT Long Term Goals - 07/12/20 1713      PT LONG TERM GOAL #1   Title Pt will be able to drive with 35% less difficulty for positioning and usage of R LE    Baseline 35% improvement    Time 6    Period Weeks    Status On-going      PT LONG TERM GOAL #2   Title Pt will demo improved R LE hip flexion/extension to >/= 4/5 to assist with transfers    Baseline flex 3+, ext 3/5    Time 6    Period Weeks    Status On-going      PT LONG TERM GOAL #3   Title Oswestry LBP disability index will be </= 25/50 for improved quality of life    Baseline 34/50    Time 6    Period Weeks    Status On-going      PT LONG TERM GOAL #4   Title pt will be able to sit x 90 min to watch a movie with </= 4/10 LBP/R LE pain    Baseline 10 min with 8/10 pain    Time 6    Period Weeks    Status On-going                 Plan - 07/28/20 1356     Clinical Impression Statement Pt with some pain this session. Treatment focused on increasing core and bilat LE strengthening. Pt remains mostly weak with her hip extensors/glutes. Pt with limited hip mobility -- performed hip mobs to address hip flexor tightness.    Examination-Activity Limitations Locomotion Level;Stand;Caring for Others;Transfers;Sit;Squat;Stairs    Examination-Participation Restrictions Shop;Cleaning;Community Activity;Driving    PT Treatment/Interventions ADLs/Self Care Home Management;Electrical Stimulation;Cryotherapy;Ultrasound;Moist Heat;Iontophoresis 4mg /ml Dexamethasone;Functional mobility training;Therapeutic exercise;Therapeutic activities;Patient/family education;Manual techniques;Passive range of motion;Dry needling;Taping    PT Next Visit Plan Continue PNE as needed. Progress core stabilization, BLE strength and pain free mobility. Modalities PRN.  hip mobilizations and progressing upper back exercises.    PT Home Exercise Plan [id number]: standing palloff press, hip abduction/extension, shoulder row & extension; clam shell    Consulted and Agree with Plan of Care Patient           Patient will benefit from skilled therapeutic intervention in order to improve the following deficits and impairments:  Abnormal gait, Decreased mobility, Difficulty walking, Hypomobility, Increased muscle spasms, Improper body mechanics, Decreased range of motion, Decreased activity tolerance, Decreased strength, Impaired flexibility, Postural dysfunction, Pain  Visit Diagnosis: Chronic bilateral low back pain, unspecified whether sciatica present  Muscle weakness (generalized)     Problem List Patient Active Problem List   Diagnosis Date Noted  . Memory loss 06/04/2017  . Neck pain 06/04/2017    [facility] [address] PT, DPT 07/28/2020, 5:28 PM  [facility] [address] [place], [place], [place] Phone: [phone]    Fax:  [phone]  Name: Robin Wright MRN: [name] Date of Birth: 08-11-1976

## 2020-08-02 ENCOUNTER — Ambulatory Visit: Payer: Medicaid Other | Admitting: Physical Therapy

## 2020-08-04 ENCOUNTER — Ambulatory Visit: Payer: Medicaid Other | Admitting: Physical Therapy

## 2020-08-10 ENCOUNTER — Other Ambulatory Visit: Payer: Self-pay

## 2020-08-10 ENCOUNTER — Ambulatory Visit: Payer: Medicaid Other | Admitting: Physical Therapy

## 2020-08-10 DIAGNOSIS — G8929 Other chronic pain: Secondary | ICD-10-CM

## 2020-08-10 DIAGNOSIS — M6281 Muscle weakness (generalized): Secondary | ICD-10-CM

## 2020-08-10 DIAGNOSIS — M545 Low back pain, unspecified: Secondary | ICD-10-CM

## 2020-08-10 NOTE — Therapy (Signed)
[place], [place], [address] Phone: [phone]   Fax:  [phone]  Physical Therapy Treatment and Re-Certification  Patient Details  Name: Robin Wright MRN: [id number] Date of Birth: [birth year] Referring Provider (PT): Tilda Franco Date: 08/10/2020   PT End of Session - 08/10/20 1413    Visit Number 13    Number of Visits 19    Date for PT Re-Evaluation 08/05/20    Authorization Type MCD - healthy blue; requested new auth    PT Start Time 1406    PT Stop Time 1446    PT Time Calculation (min) 40 min    Activity Tolerance Patient tolerated treatment well;No increased pain    Behavior During Therapy [facility] for tasks assessed/performed           No past medical history on file.  Past Surgical History:  Procedure Laterality Date  . c-section      There were no vitals filed for this visit.   Subjective Assessment - 08/10/20 1411    Subjective Pt reports overall that she's been feeling better. She's been getting better sleep. Pt notes that the morning feels the most stiff but the stretching and exercises have been helping. Pt notes that she got injections and they have helped a lot.    Pertinent History Pt has extensive back pain history beginning in 2010. Pt reports R UE/LE radicular symptoms; denies headaches from 2020. no spinal surgery.    Limitations Sitting;House hold activities;Standing;Walking    How long can you sit comfortably? 30 min to 1 hour prior to pain    How long can you stand comfortably? unsure    How long can you walk comfortably? 30 min with support    Diagnostic tests MRI scheduled for 05/17/2020 for lumbar spine; had brain CT scan several years ago in [place]    Patient Stated Goals to be less painful    Currently in Pain? Yes    Pain Score 5     Pain Location Back    Pain Orientation Lower;Right              [facility] PT Assessment - 08/10/20 0001      Strength    Overall Strength Comments R hip flexion/extension 4+/5                         [facility] Adult PT Treatment/Exercise - 08/10/20 0001      Lumbar Exercises: Aerobic   Nustep L2 x 7 min      Lumbar Exercises: Machines for Strengthening   Cybex Knee Flexion 35# 3x10    Leg Press 55# 3x10    Other Lumbar Machine Exercise --      Lumbar Exercises: Standing   Other Standing Lumbar Exercises Modified deadlift no weight x10      Lumbar Exercises: Prone   Straight Leg Raise 20 reps                  PT Education - 08/10/20 1448    Education Details Discussed transitioning to gym. Discussed current goals and POC.    Person(s) Educated Patient    Methods Explanation    Comprehension Verbalized understanding            PT Short Term Goals - 07/12/20 1713      PT SHORT TERM GOAL #1   Title Patient will be independent with HEP    Time 3  Period Weeks    Status Achieved      PT SHORT TERM GOAL #2   Title Pt will report LBP/R LE pain improvement x 25% with functional mobility    Baseline 10%    Time 3    Period Weeks    Status Achieved    Target Date 07/13/20             PT Long Term Goals - 08/10/20 1419      PT LONG TERM GOAL #1   Title Pt will be able to drive with 43% less difficulty for positioning and usage of R LE    Baseline 50%-75% improvement    Time 4    Period Weeks    Status Revised    Target Date 09/07/20      PT LONG TERM GOAL #2   Title Pt will demo improved R LE hip flexion/extension to >/= 4/5 to assist with transfers    Baseline flex 3+, ext 3/5    Time 6    Period Weeks    Status Achieved      PT LONG TERM GOAL #3   Title Oswestry LBP disability index will be </= 25/50 for improved quality of life    Baseline 27/50    Time 4    Period Weeks    Status Revised    Target Date 09/07/20      PT LONG TERM GOAL #4   Title pt will be able to sit x 90 min to watch a movie with </= 4/10 LBP/R LE pain    Baseline Reports  15-20 minutes with manageable pain    Time 6    Period Weeks    Status Partially Met                 Plan - 08/10/20 1437    Clinical Impression Statement Pt with improved pain. Treatment focused on bilat LE strengthening. Pt would benefit from 4 more weeks of therapy to reach her goals and transition her to using a community gym/wellness regimen. Pt is interested in continuing to progress to lifting. At this time pt does note a signficant difference in her ability to drive and ambulate. Pt's Oswestry Low back Questionnaire is now 27/50 (or 54% disability) which has improved greater than a 10% minimal detectable change from 68% disability.    Examination-Activity Limitations Locomotion Level;Stand;Caring for Others;Transfers;Sit;Squat;Stairs    Examination-Participation Restrictions Shop;Cleaning;Community Activity;Driving    Rehab Potential Good    PT Frequency 2x / week    PT Duration 4 weeks    PT Treatment/Interventions ADLs/Self Care Home Management;Electrical Stimulation;Cryotherapy;Ultrasound;Moist Heat;Iontophoresis 29m/ml Dexamethasone;Functional mobility training;Therapeutic exercise;Therapeutic activities;Patient/family education;Manual techniques;Passive range of motion;Dry needling;Taping    PT Next Visit Plan Progress core stabilization, BLE strength and pain free mobility. Machine strengthening. Progress/initiate lifting exercises for transition to gym    PT Home Exercise Plan [id number]: standing palloff press, hip abduction/extension, shoulder row & extension; clam shell    Consulted and Agree with Plan of Care Patient           Patient will benefit from skilled therapeutic intervention in order to improve the following deficits and impairments:  Abnormal gait, Decreased mobility, Difficulty walking, Hypomobility, Increased muscle spasms, Improper body mechanics, Decreased range of motion, Decreased activity tolerance, Decreased strength, Impaired flexibility, Postural  dysfunction, Pain  Visit Diagnosis: Chronic bilateral low back pain, unspecified whether sciatica present  Muscle weakness (generalized)     Problem List Patient Active Problem List  Diagnosis Date Noted  . Memory loss 06/04/2017  . Neck pain 06/04/2017    [facility] [address] [place] PT, DPT 08/10/2020, 4:06 PM  [facility] [address] [place], [place], [address] Phone: [phone]   Fax:  [phone]  Name: Robin Wright MRN: [id number] Date of Birth: [birth year]

## 2020-08-16 ENCOUNTER — Other Ambulatory Visit: Payer: Self-pay

## 2020-08-16 ENCOUNTER — Encounter: Payer: Self-pay | Admitting: Physical Therapy

## 2020-08-16 ENCOUNTER — Ambulatory Visit: Payer: Medicaid Other | Admitting: Physical Therapy

## 2020-08-16 DIAGNOSIS — M545 Low back pain: Secondary | ICD-10-CM | POA: Diagnosis not present

## 2020-08-16 DIAGNOSIS — G8929 Other chronic pain: Secondary | ICD-10-CM

## 2020-08-16 DIAGNOSIS — M6281 Muscle weakness (generalized): Secondary | ICD-10-CM

## 2020-08-16 NOTE — Therapy (Signed)
[place], [place], [address] Phone: [phone]   Fax:  [phone]  Physical Therapy Treatment  Patient Details  Name: Robin Wright MRN: [id number] Date of Birth: 05/16/1976 Referring Provider (PT): Tilda Franco Date: 08/16/2020   PT End of Session - 08/16/20 1237    Visit Number 14    Number of Visits 19    Date for PT Re-Evaluation 09/07/20    Authorization Type MCD - healthy blue; requested new auth given 12 visits    Authorization Time Period 08/10/20-09/14/20    Authorization - Visit Number 2    Authorization - Number of Visits 12    PT Start Time 1232    PT Stop Time 5284    PT Time Calculation (min) 41 min           History reviewed. No pertinent past medical history.  Past Surgical History:  Procedure Laterality Date  . c-section      There were no vitals filed for this visit.   Subjective Assessment - 08/16/20 1236    Subjective I am alright today.Marland Kitchen No pain. I am usually a 5/10. I want to do more of the strengthenimg like we did loast visit.    Currently in Pain? No/denies                             [facility] Adult PT Treatment/Exercise - 08/16/20 0001      Lumbar Exercises: Stretches   Lower Trunk Rotation 10 seconds    Lower Trunk Rotation Limitations 10 reps       Lumbar Exercises: Aerobic   Nustep L5 x 7 minutes       Lumbar Exercises: Machines for Strengthening   Cybex Knee Flexion 35# 3x10    Leg Press 55# 3x10      Lumbar Exercises: Standing   Other Standing Lumbar Exercises Dead lift bilat 5# KB from 4 inch step     10 # total      Lumbar Exercises: Supine   Bridge 20 reps      Lumbar Exercises: Sidelying   Clam Both;20 reps    Clam Limitations green tband      Lumbar Exercises: Prone   Straight Leg Raise 20 reps                    PT Short Term Goals - 07/12/20 1713      PT SHORT TERM GOAL #1   Title Patient will be  independent with HEP    Time 3    Period Weeks    Status Achieved      PT SHORT TERM GOAL #2   Title Pt will report LBP/R LE pain improvement x 25% with functional mobility    Baseline 10%    Time 3    Period Weeks    Status Achieved    Target Date 07/13/20             PT Long Term Goals - 08/10/20 1419      PT LONG TERM GOAL #1   Title Pt will be able to drive with 13% less difficulty for positioning and usage of R LE    Baseline 50%-75% improvement    Time 4    Period Weeks    Status Revised    Target Date 09/07/20      PT LONG TERM GOAL #2   Title  Pt will demo improved R LE hip flexion/extension to >/= 4/5 to assist with transfers    Baseline flex 3+, ext 3/5    Time 6    Period Weeks    Status Achieved      PT LONG TERM GOAL #3   Title Oswestry LBP disability index will be </= 25/50 for improved quality of life    Baseline 27/50    Time 4    Period Weeks    Status Revised    Target Date 09/07/20      PT LONG TERM GOAL #4   Title pt will be able to sit x 90 min to watch a movie with </= 4/10 LBP/R LE pain    Baseline Reports 15-20 minutes with manageable pain    Time 6    Period Weeks    Status Partially Met                 Plan - 08/16/20 1302    Clinical Impression Statement Pt arrived eager to continue strengthening and she likes the gym equipment. She reports 0/10 pain today. Progressed liftng to10lbs from 4 inch step and she tolerated this well without c/o pain.    PT Next Visit Plan Progress core stabilization, BLE strength and pain free mobility. Machine strengthening. Progress/initiate lifting exercises for transition to gym    PT Home Exercise Plan [id number]: standing palloff press, hip abduction/extension, shoulder row & extension; clam shell           Patient will benefit from skilled therapeutic intervention in order to improve the following deficits and impairments:  Abnormal gait, Decreased mobility, Difficulty walking, Hypomobility,  Increased muscle spasms, Improper body mechanics, Decreased range of motion, Decreased activity tolerance, Decreased strength, Impaired flexibility, Postural dysfunction, Pain  Visit Diagnosis: Chronic bilateral low back pain, unspecified whether sciatica present  Muscle weakness (generalized)     Problem List Patient Active Problem List   Diagnosis Date Noted  . Memory loss 06/04/2017  . Neck pain 06/04/2017    Dorene Ar, PTA 08/16/2020, 1:15 PM  [facility] [address] [place], [place], [address] Phone: [phone]   Fax:  [phone]  Name: Delma Drone MRN: [id number] Date of Birth: [birth year]

## 2020-08-23 ENCOUNTER — Ambulatory Visit: Payer: Medicaid Other | Attending: Sports Medicine

## 2020-08-23 ENCOUNTER — Other Ambulatory Visit: Payer: Self-pay

## 2020-08-23 DIAGNOSIS — M545 Low back pain, unspecified: Secondary | ICD-10-CM | POA: Insufficient documentation

## 2020-08-23 DIAGNOSIS — M6281 Muscle weakness (generalized): Secondary | ICD-10-CM | POA: Diagnosis present

## 2020-08-23 DIAGNOSIS — G8929 Other chronic pain: Secondary | ICD-10-CM | POA: Diagnosis present

## 2020-08-23 NOTE — Therapy (Signed)
[place], [place], [address] Phone: [phone]   Fax:  [phone]  Physical Therapy Treatment  Patient Details  Name: Robin Wright MRN: [id number] Date of Birth: 08/25/1976 Referring Provider (PT): Tilda Franco Date: 08/23/2020   PT End of Session - 08/23/20 1545    Visit Number 15    Number of Visits 19    Date for PT Re-Evaluation 09/07/20    Authorization Type MCD - healthy blue; requested new auth given 12 visits    Authorization Time Period 08/10/20-09/14/20    Authorization - Visit Number 2    Authorization - Number of Visits 12    PT Start Time 6761    PT Stop Time 1626    PT Time Calculation (min) 48 min    Activity Tolerance Patient tolerated treatment well;No increased pain    Behavior During Therapy [facility] for tasks assessed/performed           No past medical history on file.  Past Surgical History:  Procedure Laterality Date  . c-section      There were no vitals filed for this visit.   Subjective Assessment - 08/23/20 1543    Subjective Pt reports she feels ok today, that she had some pain the other day but it goes up with level of stress. The stretching is good, but the strengthening has really helped.    Currently in Pain? No/denies                             [facility] Adult PT Treatment/Exercise - 08/23/20 0001      Lumbar Exercises: Aerobic   Nustep L6 x 7'      Lumbar Exercises: Machines for Strengthening   Cybex Knee Flexion 35# 2x15    Leg Press 55# 2x15      Lumbar Exercises: Standing   Functional Squats 15 reps    Functional Squats Limitations Handheld assist    Other Standing Lumbar Exercises Dead lift bilat 5# KB from 4 inch step     10 # total    Other Standing Lumbar Exercises Step ups onto platform x10 B c B UE assist      Lumbar Exercises: Supine   Bridge 20 reps      Lumbar Exercises: Sidelying   Clam Both;20 reps    Clam  Limitations green tband      Lumbar Exercises: Prone   Straight Leg Raise 20 reps                    PT Short Term Goals - 07/12/20 1713      PT SHORT TERM GOAL #1   Title Patient will be independent with HEP    Time 3    Period Weeks    Status Achieved      PT SHORT TERM GOAL #2   Title Pt will report LBP/R LE pain improvement x 25% with functional mobility    Baseline 10%    Time 3    Period Weeks    Status Achieved    Target Date 07/13/20             PT Long Term Goals - 08/10/20 1419      PT LONG TERM GOAL #1   Title Pt will be able to drive with 95% less difficulty for positioning and usage of R LE    Baseline 50%-75% improvement  Time 4    Period Weeks    Status Revised    Target Date 09/07/20      PT LONG TERM GOAL #2   Title Pt will demo improved R LE hip flexion/extension to >/= 4/5 to assist with transfers    Baseline flex 3+, ext 3/5    Time 6    Period Weeks    Status Achieved      PT LONG TERM GOAL #3   Title Oswestry LBP disability index will be </= 25/50 for improved quality of life    Baseline 27/50    Time 4    Period Weeks    Status Revised    Target Date 09/07/20      PT LONG TERM GOAL #4   Title pt will be able to sit x 90 min to watch a movie with </= 4/10 LBP/R LE pain    Baseline Reports 15-20 minutes with manageable pain    Time 6    Period Weeks    Status Partially Met                 Plan - 08/23/20 1545    Clinical Impression Statement Pt tolerated tx well with increase in reps to 15 for seated LE exercises. She was additionally able to perform functional squat and step ups today.    PT Next Visit Plan Progress core stabilization, BLE strength and pain free mobility. Machine strengthening. Progress/initiate lifting exercises for transition to gym    PT Home Exercise Plan [id number]: standing palloff press, hip abduction/extension, shoulder row & extension; clam shell           Patient will benefit from  skilled therapeutic intervention in order to improve the following deficits and impairments:  Abnormal gait, Decreased mobility, Difficulty walking, Hypomobility, Increased muscle spasms, Improper body mechanics, Decreased range of motion, Decreased activity tolerance, Decreased strength, Impaired flexibility, Postural dysfunction, Pain  Visit Diagnosis: Chronic bilateral low back pain, unspecified whether sciatica present  Muscle weakness (generalized)     Problem List Patient Active Problem List   Diagnosis Date Noted  . Memory loss 06/04/2017  . Neck pain 06/04/2017    Izell Dacono, PT, DPT 08/23/2020, 4:30 PM  [facility] [address] [place], [place], [address] Phone: [phone]   Fax:  [phone]  Name: Robin Wright MRN: [id number] Date of Birth: Dec 01, 1975

## 2020-08-30 ENCOUNTER — Ambulatory Visit: Payer: Medicaid Other

## 2020-08-30 ENCOUNTER — Telehealth: Payer: Self-pay

## 2020-08-30 NOTE — Telephone Encounter (Signed)
Called pt at 2:15 PM to check in due to not showing up for 1:45 PM appt. LM VM for pt asking her to call us if she cannot make her 1:30 PM appt on Thurs. 10/14.  Robin Wright. Corliss Marcus, PT, DPT

## 2020-09-01 ENCOUNTER — Other Ambulatory Visit: Payer: Self-pay

## 2020-09-01 ENCOUNTER — Ambulatory Visit: Payer: Medicaid Other

## 2020-09-01 DIAGNOSIS — M545 Low back pain, unspecified: Secondary | ICD-10-CM | POA: Diagnosis not present

## 2020-09-01 DIAGNOSIS — M6281 Muscle weakness (generalized): Secondary | ICD-10-CM

## 2020-09-01 DIAGNOSIS — G8929 Other chronic pain: Secondary | ICD-10-CM

## 2020-09-01 NOTE — Therapy (Addendum)
[place], [place], [address] Phone: [phone]   Fax:  [phone]  Physical Therapy Treatment/Discharge  Patient Details  Name: Robin Wright MRN: [id number] Date of Birth: 04/23/1976 Referring Provider (PT): Tilda Franco Date: 09/01/2020   PT End of Session - 09/01/20 1352    Visit Number 16    Number of Visits 19    Date for PT Re-Evaluation 09/07/20    Authorization Type MCD - healthy blue; requested new auth given 12 visits    Authorization Time Period 08/10/20-09/14/20    Authorization - Visit Number 2    Authorization - Number of Visits 12    PT Start Time 4628    PT Stop Time 1413    PT Time Calculation (min) 35 min    Activity Tolerance Patient tolerated treatment well;No increased pain    Behavior During Therapy [facility] for tasks assessed/performed           No past medical history on file.  Past Surgical History:  Procedure Laterality Date  . c-section      There were no vitals filed for this visit.                      [place] Adult PT Treatment/Exercise - 09/01/20 0001      Lumbar Exercises: Aerobic   Nustep L7 UE/LE       Lumbar Exercises: Standing   Functional Squats 15 reps    Functional Squats Limitations Handheld assist    Other Standing Lumbar Exercises Step ups onto platform x10 B c B UE assist                    PT Short Term Goals - 07/12/20 1713      PT SHORT TERM GOAL #1   Title Patient will be independent with HEP    Time 3    Period Weeks    Status Achieved      PT SHORT TERM GOAL #2   Title Pt will report LBP/R LE pain improvement x 25% with functional mobility    Baseline 10%    Time 3    Period Weeks    Status Achieved    Target Date 07/13/20             PT Long Term Goals - 08/10/20 1419      PT LONG TERM GOAL #1   Title Pt will be able to drive with 63% less difficulty for positioning and usage of R LE     Baseline 50%-75% improvement    Time 4    Period Weeks    Status Revised    Target Date 09/07/20      PT LONG TERM GOAL #2   Title Pt will demo improved R LE hip flexion/extension to >/= 4/5 to assist with transfers    Baseline flex 3+, ext 3/5    Time 6    Period Weeks    Status Achieved      PT LONG TERM GOAL #3   Title Oswestry LBP disability index will be </= 25/50 for improved quality of life    Baseline 27/50    Time 4    Period Weeks    Status Revised    Target Date 09/07/20      PT LONG TERM GOAL #4   Title pt will be able to sit x 90 min to watch a movie with </= 4/10  LBP/R LE pain    Baseline Reports 15-20 minutes with manageable pain    Time 6    Period Weeks    Status Partially Met                 Plan - 09/01/20 1354    Clinical Impression Statement Pt will plan to DC next visit. She was able to progress to lateral step ups and educated on how to perform most of her ther ex at home.    PT Next Visit Plan Plan to DC next visit for transition to home/gym program    PT Home Exercise Plan [id number]: standing palloff press, hip abduction/extension, shoulder row & extension; clam shell    Consulted and Agree with Plan of Care Patient           Patient will benefit from skilled therapeutic intervention in order to improve the following deficits and impairments:  Abnormal gait, Decreased mobility, Difficulty walking, Hypomobility, Increased muscle spasms, Improper body mechanics, Decreased range of motion, Decreased activity tolerance, Decreased strength, Impaired flexibility, Postural dysfunction, Pain  Visit Diagnosis: Chronic bilateral low back pain, unspecified whether sciatica present  Muscle weakness (generalized)     Problem List Patient Active Problem List   Diagnosis Date Noted  . Memory loss 06/04/2017  . Neck pain 06/04/2017    Phill Myron. Felipa Emory, DPT  [facility] [address] [place], [place], [address] Phone: [phone]   Fax:  [phone]  Name: Robin Wright MRN: [id number] Date of Birth: March 04, 1976   PHYSICAL THERAPY DISCHARGE SUMMARY  Visits from Start of Care: 16  Current functional level related to goals / functional outcomes: Good   Remaining deficits: Continued knee pain   Education / Equipment: HEP  Plan: Patient agrees to discharge.  Patient goals were met. Patient is being discharged due to meeting the stated rehab goals.  ?????

## 2020-09-06 ENCOUNTER — Ambulatory Visit: Payer: Medicaid Other | Admitting: Physical Therapy

## 2020-09-07 ENCOUNTER — Telehealth: Payer: Self-pay | Admitting: Physical Therapy

## 2020-09-07 ENCOUNTER — Ambulatory Visit: Payer: Medicaid Other | Admitting: Physical Therapy

## 2020-09-07 NOTE — Telephone Encounter (Signed)
Returned patient's call as requested after she had to cancel her appointment due to transportation. She was agreeable to discharge to HEP at this time as today was going to be her last visit and she is at the end of POC.  Advised patient to f/u with MD regarding on- gong knee and back pain.

## 2021-01-16 ENCOUNTER — Encounter (HOSPITAL_COMMUNITY): Payer: Self-pay

## 2021-01-16 ENCOUNTER — Other Ambulatory Visit: Payer: Self-pay

## 2021-01-16 ENCOUNTER — Emergency Department (HOSPITAL_COMMUNITY): Payer: Medicaid Other

## 2021-01-16 ENCOUNTER — Emergency Department (HOSPITAL_COMMUNITY)
Admission: EM | Admit: 2021-01-16 | Discharge: 2021-01-17 | Disposition: A | Discharge status: Home / Self Care | Payer: Medicaid Other | Attending: Emergency Medicine | Admitting: Emergency Medicine

## 2021-01-16 DIAGNOSIS — F17210 Nicotine dependence, cigarettes, uncomplicated: Secondary | ICD-10-CM | POA: Insufficient documentation

## 2021-01-16 DIAGNOSIS — R6884 Jaw pain: Secondary | ICD-10-CM | POA: Diagnosis not present

## 2021-01-16 DIAGNOSIS — R1084 Generalized abdominal pain: Secondary | ICD-10-CM | POA: Diagnosis not present

## 2021-01-16 DIAGNOSIS — K0889 Other specified disorders of teeth and supporting structures: Secondary | ICD-10-CM | POA: Insufficient documentation

## 2021-01-16 DIAGNOSIS — D649 Anemia, unspecified: Secondary | ICD-10-CM | POA: Diagnosis not present

## 2021-01-16 LAB — COMPREHENSIVE METABOLIC PANEL
ALT: 15 U/L (ref 0–44)
AST: 17 U/L (ref 15–41)
Albumin: 3.5 g/dL (ref 3.5–5.0)
Alkaline Phosphatase: 54 U/L (ref 38–126)
Anion gap: 10 (ref 5–15)
BUN: 16 mg/dL (ref 6–20)
CO2: 23 mmol/L (ref 22–32)
Calcium: 8.9 mg/dL (ref 8.9–10.3)
Chloride: 105 mmol/L (ref 98–111)
Creatinine, Ser: 0.77 mg/dL (ref 0.44–1.00)
GFR, Estimated: >60 mL/min (ref >60)
Glucose, Bld: 168 mg/dL — ABNORMAL HIGH (ref 70–99)
Potassium: 3.9 mmol/L (ref 3.5–5.1)
Sodium: 138 mmol/L (ref 135–145)
Total Bilirubin: 0.4 mg/dL (ref 0.3–1.2)
Total Protein: 7.6 g/dL (ref 6.5–8.1)

## 2021-01-16 LAB — CBC
HCT: 28.6 % — ABNORMAL LOW (ref 36.0–46.0)
Hemoglobin: 7.5 g/dL — ABNORMAL LOW (ref 12.0–15.0)
MCH: 16.2 pg — ABNORMAL LOW (ref 26.0–34.0)
MCHC: 26.2 g/dL — ABNORMAL LOW (ref 30.0–36.0)
MCV: 61.8 fL — ABNORMAL LOW (ref 80.0–100.0)
Platelets: 473 10*3/uL — ABNORMAL HIGH (ref 150–400)
RBC: 4.63 MIL/uL (ref 3.87–5.11)
RDW: 22.6 % — ABNORMAL HIGH (ref 11.5–15.5)
WBC: 5.1 10*3/uL (ref 4.0–10.5)
nRBC: 0.4 % — ABNORMAL HIGH (ref 0.0–0.2)

## 2021-01-16 LAB — I-STAT BETA HCG BLOOD, ED (MC, WL, AP ONLY): I-stat hCG, quantitative: <5 m[IU]/mL (ref <5)

## 2021-01-16 LAB — URINALYSIS, ROUTINE W REFLEX MICROSCOPIC
Bilirubin Urine: NEGATIVE
Glucose, UA: NEGATIVE mg/dL
Hgb urine dipstick: NEGATIVE
Ketones, ur: NEGATIVE mg/dL
Leukocytes,Ua: NEGATIVE
Nitrite: NEGATIVE
Protein, ur: NEGATIVE mg/dL
Specific Gravity, Urine: 1.031 — ABNORMAL HIGH (ref 1.005–1.030)
pH: 5 (ref 5.0–8.0)

## 2021-01-16 LAB — LIPASE, BLOOD: Lipase: 35 U/L (ref 11–51)

## 2021-01-16 MED ORDER — FENTANYL CITRATE (PF) 100 MCG/2ML IJ SOLN
50.0000 ug | Freq: Once | INTRAMUSCULAR | Status: AC
  Administered 2021-01-16: 50 ug via INTRAVENOUS
  Filled 2021-01-16: qty 2

## 2021-01-16 MED ORDER — IOHEXOL 300 MG/ML  SOLN
100.0000 mL | Freq: Once | INTRAMUSCULAR | Status: AC | PRN
  Administered 2021-01-16: 100 mL via INTRAVENOUS

## 2021-01-16 MED ORDER — PENICILLIN V POTASSIUM 500 MG PO TABS: 500.0000 mg | ORAL_TABLET | Freq: Four times a day (QID) | ORAL | 0 refills | Status: DC

## 2021-01-16 NOTE — ED Provider Notes (Addendum)
[facility]-EMERGENCY DEPT Provider Note   CSN: [id number] Arrival date & time: 01/16/21  1927     History Chief Complaint  Patient presents with  . Abdominal Pain  . Dental Pain    Robin Wright is a 45 y.o. female.  Presented to the emergency room with concern for abdominal pain, jaw pain.  Regarding dental pain, pain started today, right lower molar region.  States Robin Wright has a tooth that Robin Wright thinks needs to be removed.  Has not noted any swelling, no fever, no drainage.  Has a dentist.  Took naproxen and a dose of penicillin with some improvement.  Regarding abdominal pain, ongoing for the past week.  Intermittent, worse on left side, upper and lower.  Achy.  Moderate.  Denies any vaginal discharge, no vaginal bleeding, no recent heavy periods.  No blood in stool, no dark or tarry stools.  HPI     History reviewed. No pertinent past medical history.  Patient Active Problem List   Diagnosis Date Noted  . Memory loss 06/04/2017  . Neck pain 06/04/2017    Past Surgical History:  Procedure Laterality Date  . c-section       OB History   No obstetric history on file.     Family History  Problem Relation Age of Onset  . Hypertension Mother   . Diabetes Mother   . Cirrhosis Father     Social History   Tobacco Use  . Smoking status: Current Every Day Smoker    Packs/day: 0.50    Types: Cigarettes    Start date: 12/26/2001  . Smokeless tobacco: Current User  Substance Use Topics  . Alcohol use: No  . Drug use: No    Home Medications Prior to Admission medications   Medication Sig Start Date End Date Taking? Authorizing Provider  ferrous sulfate 325 (65 FE) MG tablet Take 325 mg by mouth daily with breakfast.    [provider]  gabapentin (NEURONTIN) 100 MG capsule Take 1 capsule (100 mg total) by mouth 3 (three) times daily. 04/11/17   Kerrin Champagne, MD  naproxen (NAPROSYN) 500 MG tablet Take 1 tablet (500 mg total) by mouth  2 (two) times daily. Patient not taking: Reported on 05/11/2020 08/29/18   Henderly, Britni A, PA-C    Allergies    Patient has no known allergies.  Review of Systems   Review of Systems  Constitutional: Negative for chills and fever.  HENT: Positive for dental problem. Negative for ear pain and sore throat.   Eyes: Negative for pain and visual disturbance.  Respiratory: Negative for cough and shortness of breath.   Cardiovascular: Negative for chest pain and palpitations.  Gastrointestinal: Positive for abdominal pain and nausea. Negative for vomiting.  Genitourinary: Negative for dysuria and hematuria.  Musculoskeletal: Negative for arthralgias and back pain.  Skin: Negative for color change and rash.  Neurological: Negative for seizures and syncope.  All other systems reviewed and are negative.   Physical Exam Updated Vital Signs BP 123/89   Pulse 83   Temp 98.1 F (36.7 C) (Oral)   Resp 18   Ht 5\' 6"  (1.676 m)   Wt 133.8 kg   SpO2 100%   BMI 47.61 kg/m   Physical Exam Vitals and nursing note reviewed.  Constitutional:      General: Robin Wright is not in acute distress.    Appearance: Robin Wright is well-developed and well-nourished.  HENT:     Head: Normocephalic and atraumatic.  Comments: Poor dentition, tenderness to palpation over right lower molar, extensive dental caries, no abscess noted Eyes:     Conjunctiva/sclera: Conjunctivae normal.  Cardiovascular:     Rate and Rhythm: Normal rate and regular rhythm.     Heart sounds: No murmur heard.   Pulmonary:     Effort: Pulmonary effort is normal. No respiratory distress.     Breath sounds: Normal breath sounds.  Abdominal:     Palpations: Abdomen is soft.     Tenderness: There is abdominal tenderness in the left lower quadrant. There is no guarding or rebound. Negative signs include Murphy's sign and McBurney's sign.  Musculoskeletal:        General: No edema.     Cervical back: Neck supple.  Skin:    General: Skin  is warm and dry.     Capillary Refill: Capillary refill takes less than 2 seconds.  Neurological:     Mental Status: Robin Wright is alert.  Psychiatric:        Mood and Affect: Mood and affect normal.     ED Results / Procedures / Treatments   Labs (all labs ordered are listed, but only abnormal results are displayed) Labs Reviewed  COMPREHENSIVE METABOLIC PANEL - Abnormal; Notable for the following components:      Result Value   Glucose, Bld 168 (*)    All other components within normal limits  CBC - Abnormal; Notable for the following components:   Hemoglobin 7.5 (*)    HCT 28.6 (*)    MCV 61.8 (*)    MCH 16.2 (*)    MCHC 26.2 (*)    RDW 22.6 (*)    Platelets 473 (*)    nRBC 0.4 (*)    All other components within normal limits  URINALYSIS, ROUTINE W REFLEX MICROSCOPIC - Abnormal; Notable for the following components:   APPearance CLOUDY (*)    Specific Gravity, Urine 1.031 (*)    All other components within normal limits  LIPASE, BLOOD  I-STAT BETA HCG BLOOD, ED ([facility], [facility], [facility] ONLY)    EKG None  Radiology No results found.  Procedures Procedures   Medications Ordered in ED Medications  fentaNYL (SUBLIMAZE) injection 50 mcg (50 mcg Intravenous Given 01/16/21 2236)    ED Course  I have reviewed the triage vital signs and the nursing notes.  Pertinent labs & imaging results that were available during my care of the patient were reviewed by me and considered in my medical decision making (see chart for details).    MDM Rules/Calculators/A&P                         45 year old lady presenting to ER with 2 concerns.  Regarding dental pain, noted to have dental caries but no abscess or swelling noted.  Believe patient would benefit from course of antibiotics and close dentistry follow-up.  Regarding abdominal pain, noted to have some tenderness in her left lower quadrant on my exam.  Lab work grossly stable except noted anemia.  No heavy bleeding, no blood in stools.   Last check was many years ago.  Suspect this may be a chronic and incidental finding.  Will need close outpatient PCP follow-up.  For the tenderness on exam, will check CT abdomen pelvis to further evaluate.  CT scan demonstrating findings suggestive of possible right hydrosalpinx, ovarian cyst.  On reassessment, patient has no ongoing abdominal pain.  Robin Wright denied any right-sided or right lower or right  pelvic symptoms.  Suspect this is an incidental finding.  Recommend Robin Wright follow-up with a gynecologist for further evaluation.  Given patient has stable vital signs and ongoing symptoms, believe Robin Wright is stable and appropriate for discharge home at this time.  After the discussed management above, the patient was determined to be safe for discharge.  The patient was in agreement with this plan and all questions regarding their care were answered.  ED return precautions were discussed and the patient will return to the ED with any significant worsening of condition.   Final Clinical Impression(s) / ED Diagnoses Final diagnoses:  Anemia, unspecified type  Pain, dental  Generalized abdominal pain    Rx / DC Orders ED Discharge Orders    None       Milagros Loll, MD 01/16/21 2307    Milagros Loll, MD 01/17/21 0002

## 2021-01-16 NOTE — ED Triage Notes (Signed)
Pt reports upper abdominal pain x 3 days. Also c/o right lower dental pain radiating through jaw.

## 2021-01-16 NOTE — Discharge Instructions (Addendum)
Take antibiotic as prescribed.  Follow-up with your dentist.  For your anemia and abdominal pain, please follow-up with your primary care doctor for recheck and repeat of your lab work in the next few days to 1 week.  Regarding the CT scan findings, follow-up with your gynecologist.  You have worsening pain, vomiting or other new concerning symptom, return to ER for reassessment.

## 2021-01-17 MED ORDER — PENICILLIN V POTASSIUM 500 MG PO TABS: 500.0000 mg | ORAL_TABLET | Freq: Four times a day (QID) | ORAL | 0 refills | Status: AC

## 2021-01-20 ENCOUNTER — Encounter (HOSPITAL_COMMUNITY): Payer: Self-pay

## 2021-01-20 ENCOUNTER — Other Ambulatory Visit: Payer: Self-pay

## 2021-01-20 ENCOUNTER — Emergency Department (HOSPITAL_COMMUNITY)
Admission: EM | Admit: 2021-01-20 | Discharge: 2021-01-20 | Disposition: A | Discharge status: Home / Self Care | Payer: Medicaid Other | Attending: Emergency Medicine | Admitting: Emergency Medicine

## 2021-01-20 ENCOUNTER — Telehealth: Payer: Self-pay | Admitting: *Deleted

## 2021-01-20 DIAGNOSIS — K0889 Other specified disorders of teeth and supporting structures: Secondary | ICD-10-CM | POA: Insufficient documentation

## 2021-01-20 DIAGNOSIS — F17210 Nicotine dependence, cigarettes, uncomplicated: Secondary | ICD-10-CM | POA: Diagnosis not present

## 2021-01-20 MED ORDER — OXYCODONE-ACETAMINOPHEN 5-325 MG PO TABS: 2.0000 | ORAL_TABLET | Freq: Four times a day (QID) | ORAL | 0 refills | Status: DC | PRN

## 2021-01-20 MED ORDER — OXYCODONE-ACETAMINOPHEN 5-325 MG PO TABS: 1.0000 | ORAL_TABLET | Freq: Four times a day (QID) | ORAL | 0 refills | Status: DC | PRN

## 2021-01-20 NOTE — ED Triage Notes (Signed)
Pt presents with c/o dental pain. Pt reports she has had dental pain on 2/28, was seen previously for same. Pt reports the pain is on the right side of her mouth.

## 2021-01-20 NOTE — Discharge Instructions (Addendum)
Please continue take your penicillin as prescribed.  Please drink plenty of water.  Please follow-up with a dentist.  I given you the information for our on-call dentist here in the ER.  Please give their office a call today to see if they can schedule you for earlier dental intervention.  You may use the ibuprofen as prescribed every 6 hours.  You may alternate this with the Percocet that I prescribed you I recommend adding a single 500mg  tylenol to the percocet.  Not to exceed 4 g of Tylenol within 24 hours.  Not to exceed 3200 mg ibuprofen 24 hours.

## 2021-01-20 NOTE — ED Provider Notes (Signed)
[facility]-EMERGENCY DEPT Provider Note   CSN: [id number] Arrival date & time: 01/20/21  1113     History   Chief Complaint Chief Complaint  Patient presents with  . Dental Pain    HPI Robin Wright is a 45 y.o. female.  Patient presents to the emergency department with a dental complaint. Symptoms began 2/28. The patient has tried to alleviate pain with ibuprofen.  Pain rated as severe, characterized as throbbing in nature and located R lower dentition. Patient denies fever, night sweats, chills, difficulty swallowing or opening mouth, SOB, nuchal rigidity or decreased ROM of neck.  Patient does not have a dentist and requests a resource guide at discharge.     HPI  History reviewed. No pertinent past medical history.  Patient Active Problem List   Diagnosis Date Noted  . Memory loss 06/04/2017  . Neck pain 06/04/2017    Past Surgical History:  Procedure Laterality Date  . c-section       OB History   No obstetric history on file.      Home Medications    Prior to Admission medications   Medication Sig Start Date End Date Taking? Authorizing Provider  ferrous sulfate 325 (65 FE) MG tablet Take 325 mg by mouth daily with breakfast.    [provider]  gabapentin (NEURONTIN) 100 MG capsule Take 1 capsule (100 mg total) by mouth 3 (three) times daily. 04/11/17   Kerrin Champagne, MD  naproxen (NAPROSYN) 500 MG tablet Take 1 tablet (500 mg total) by mouth 2 (two) times daily. Patient not taking: Reported on 05/11/2020 08/29/18   Henderly, Britni A, PA-C  oxyCODONE-acetaminophen (PERCOCET/ROXICET) 5-325 MG tablet Take 1 tablet by mouth every 6 (six) hours as needed for severe pain ((ALTERNATE WITH YOUR IBUPROFEN)). 01/20/21   Gailen Shelter, PA  penicillin v potassium (VEETID) 500 MG tablet Take 1 tablet (500 mg total) by mouth 4 (four) times daily for 7 days. 01/17/21 01/24/21  Milagros Loll, MD    Family History Family History  Problem  Relation Age of Onset  . Hypertension Mother   . Diabetes Mother   . Cirrhosis Father     Social History Social History   Tobacco Use  . Smoking status: Current Every Day Smoker    Packs/day: 0.50    Types: Cigarettes    Start date: 12/26/2001  . Smokeless tobacco: Current User  Substance Use Topics  . Alcohol use: No  . Drug use: No     Allergies   Patient has no known allergies.   Review of Systems Denies fevers, chills, difficulty swallowing or eating, changes in voice, pain under tongue, nausea, vomiting, lightheadedness or dizziness. No trismus   Physical Exam Updated Vital Signs BP (!) 163/126 (BP Location: Left Arm)   Pulse 87   Temp 98.2 F (36.8 C) (Oral)   Resp 18   LMP 12/31/2020 (Approximate)   SpO2 94%   Physical Exam Physical Exam  Constitutional: Pt appears well-developed and well-nourished.  HENT:  Head: Normocephalic.  Right Ear: Tympanic membrane, external ear and ear canal normal.  Left Ear: Tympanic membrane, external ear and ear canal normal.  Nose: Nose normal. Right sinus exhibits no maxillary sinus tenderness and no frontal sinus tenderness. Left sinus exhibits no maxillary sinus tenderness and no frontal sinus tenderness.  Mouth/Throat: Uvula is midline, oropharynx is clear and moist and mucous membranes are normal. No oral lesions. No uvula swelling or lacerations. No oropharyngeal exudate, posterior oropharyngeal  edema, posterior oropharyngeal erythema or tonsillar abscesses.  Poor dentition No gingival swelling, fluctuance or induration No gross abscess  No sublingual edema, tenderness to palpation, or sign of Ludwig's angina, or deep space infection Pain at R lower gumline Eyes: Conjunctivae are normal. Pupils are equal, round, and reactive to light. Right eye exhibits no discharge. Left eye exhibits no discharge.  Neck: Normal range of motion. Neck supple.  No stridor Handling secretions without difficulty No nuchal rigidity No  cervical lymphadenopathy Cardiovascular: Normal rate, regular rhythm and normal heart sounds.   Pulmonary/Chest: Effort normal. No respiratory distress.  Equal chest rise  Abdominal: Soft. Bowel sounds are normal. Pt exhibits no distension. There is no tenderness.  Lymphadenopathy: Pt has no cervical adenopathy.  Neurological: Pt is alert and oriented x 4  Skin: Skin is warm and dry.  Psychiatric: Pt has a normal mood and affect.  Nursing note and vitals reviewed.   ED Treatments / Results  Labs (all labs ordered are listed, but only abnormal results are displayed) Labs Reviewed - No data to display  EKG    Radiology No results found.  Procedures Procedures (including critical care time)  Medications Ordered in ED Medications - No data to display   Initial Impression / Assessment and Plan / ED Course  I have reviewed the triage vital signs and the nursing notes.  Pertinent labs & imaging results that were available during my care of the patient were reviewed by me and considered in my medical decision making (see chart for details).  Patient here with dental pain. See PE for full details of exam  Clinical Course as of 01/20/21 1324  Fri Jan 20, 2021  1220 Pt arrived with out of date 20x tablets of Percocet.  These were out of date therefore offered to dispose of them for patient.  There were disposed of by RN staff [place].  Will represcribe patient with short course of Percocet for her pain. [WF]    Clinical Course User Index [WF] Gailen Shelter, [place]   Patient is somewhat hypertensive.  Has no symptoms of hypertension, chest pain, shortness of breath, neurologic symptoms or pain anywhere apart from her tooth pain.  I suspect that her blood pressure is elevated secondary to the pain.  Patient is already on antibiotics from her last visit to the ER.  She will continue these.    Patient with dentalgia.  No abscess requiring immediate incision and drainage.  Exam not  concerning for Ludwig's angina or pharyngeal abscess.  Will treat with analgesics in the form of Percocet given that patient has an empty Percocet which she plans to use if not represcribed this. Pt instructed to follow-up with dentist.  Discussed return precautions. Pt safe for discharge.  Patient medication sent to her pharmacy.  Final Clinical Impressions(s) / ED Diagnoses   Final diagnoses:  Pain, dental    ED Discharge Orders         Ordered    oxyCODONE-acetaminophen (PERCOCET/ROXICET) 5-325 MG tablet  Every 6 hours PRN,   Status:  Discontinued        01/20/21 1138    oxyCODONE-acetaminophen (PERCOCET/ROXICET) 5-325 MG tablet  Every 6 hours PRN        01/20/21 1220            Solon Augusta Clark, [place] 01/20/21 1629    Cheryll Cockayne, MD 01/21/21 1117

## 2021-01-20 NOTE — Telephone Encounter (Signed)
Pharmacy called regarding diagnosis for pt prescribed Percocet.  RNCM reviewed the chart to find pt dx was dental pain.  Pharm D suggested lowering sig to 1 tablet every 6 hours.  RNCM contacted EDP [place], who will resend with suggested sig.

## 2021-03-02 ENCOUNTER — Ambulatory Visit (INDEPENDENT_AMBULATORY_CARE_PROVIDER_SITE_OTHER): Payer: Medicaid Other | Admitting: Family Medicine

## 2021-03-02 ENCOUNTER — Other Ambulatory Visit: Payer: Self-pay

## 2021-03-02 ENCOUNTER — Encounter: Payer: Self-pay | Admitting: Family Medicine

## 2021-03-02 VITALS — BP 113/75 | HR 99 | Ht 65.0 in | Wt 310.1 lb

## 2021-03-02 DIAGNOSIS — M5126 Other intervertebral disc displacement, lumbar region: Secondary | ICD-10-CM | POA: Diagnosis not present

## 2021-03-02 DIAGNOSIS — R102 Pelvic and perineal pain: Secondary | ICD-10-CM | POA: Diagnosis not present

## 2021-03-02 NOTE — Progress Notes (Signed)
    Subjective:    Patient ID: Robin Wright is a 45 y.o. female presenting with Follow-up  on 03/02/2021  HPI: Mid February, had upper abdomainl pain, feels sharp and aching and gas. Has some pain in left sided pelvic pain. No fever, having gas and constipation and diarrhea. Has loss of appetite. Eats corn starch. Cycles are heavy first 2 days for years, last 7 days, come monthly. Has had pain since age 24. H/o PID at age 89  Review of Systems  Constitutional: Negative for chills and fever.  Respiratory: Negative for shortness of breath.   Cardiovascular: Negative for chest pain.  Gastrointestinal: Negative for abdominal pain, nausea and vomiting.  Genitourinary: Negative for dysuria.  Skin: Negative for rash.      Objective:    BP 113/75   Pulse 99   Ht 5\' 5"  (1.651 m)   Wt (!) 310 lb 1.6 oz (140.7 kg)   LMP 02/20/2021 (Exact Date)   BMI 51.60 kg/m  Physical Exam Constitutional:      General: She is not in acute distress.    Appearance: She is well-developed.  HENT:     Head: Normocephalic and atraumatic.  Eyes:     General: No scleral icterus. Cardiovascular:     Rate and Rhythm: Normal rate.  Pulmonary:     Effort: Pulmonary effort is normal.  Abdominal:     Palpations: Abdomen is soft.  Genitourinary:    Comments: BUS normal, vagina is pink and rugated, cervix is parous without lesion, uterus is small and anteverted, no adnexal mass or mild left adnexal tenderness.  Musculoskeletal:     Cervical back: Neck supple.  Skin:    General: Skin is warm and dry.  Neurological:     Mental Status: She is alert and oriented to person, place, and time.   CT from 12/2020: Findings which may be suggestive of right hydrosalpinx and a mildly enlarged right ovary which may be from a right ovarian cyst. If further evaluation is required would recommend pelvic ultrasound  No other acute intra-abdominal or pelvic pathology to explain the patient's symptoms.       Assessment & Plan:   Problem List Items Addressed This Visit      Unprioritized   Lumbar herniated disc   Pelvic pain - Primary    Will check pelvic sonogram. CT suggested right sided cyst and hydrosalpinx. Has completed child bearing. Has h/o PID and is s/p BTL, either of which could cause hydrosalpinx. She has left sided pain, but findings on CT are on the right. Treatment based on u/s findings.      Relevant Orders   01/2021 PELVIC COMPLETE WITH TRANSVAGINAL      Return in about 4 weeks (around 03/30/2021) for a follow-up, virtual.  05/30/2021 03/02/2021 5:20 PM

## 2021-03-02 NOTE — Progress Notes (Signed)
Pt states having LLQ pain and upper abd pain, especially at night. Pt states having symptoms of bloating, gas, constipation with abd pain. Denies ever seeing GI.   Pt states needing screening in Mammogram. Pt advised to call and [facility] appt with [facility]. Pt verbalized understanding.

## 2021-03-02 NOTE — Assessment & Plan Note (Signed)
Will check pelvic sonogram. CT suggested right sided cyst and hydrosalpinx. Has completed child bearing. Has h/o PID and is s/p BTL, either of which could cause hydrosalpinx. She has left sided pain, but findings on CT are on the right. Treatment based on u/s findings.

## 2021-03-14 ENCOUNTER — Other Ambulatory Visit: Payer: Self-pay | Admitting: Family Medicine

## 2021-03-14 DIAGNOSIS — Z1231 Encounter for screening mammogram for malignant neoplasm of breast: Secondary | ICD-10-CM

## 2021-03-27 ENCOUNTER — Ambulatory Visit
Admission: RE | Admit: 2021-03-27 | Discharge: 2021-03-27 | Disposition: A | Discharge status: Home / Self Care | Payer: Medicaid Other | Source: Ambulatory Visit | Attending: Family Medicine | Admitting: Family Medicine

## 2021-03-27 ENCOUNTER — Other Ambulatory Visit: Payer: Self-pay

## 2021-03-27 DIAGNOSIS — R102 Pelvic and perineal pain: Secondary | ICD-10-CM | POA: Diagnosis present

## 2021-04-07 ENCOUNTER — Telehealth (INDEPENDENT_AMBULATORY_CARE_PROVIDER_SITE_OTHER): Payer: Medicaid Other | Admitting: Family Medicine

## 2021-04-07 ENCOUNTER — Encounter: Payer: Self-pay | Admitting: Family Medicine

## 2021-04-07 DIAGNOSIS — R102 Pelvic and perineal pain: Secondary | ICD-10-CM | POA: Diagnosis not present

## 2021-04-07 NOTE — Assessment & Plan Note (Signed)
Discussed possible causes of hydrosalpinx, including BTL. She has intimated she might want her fibroid treated.  Will need to discuss at next visit.

## 2021-04-07 NOTE — Progress Notes (Signed)
TELEPHONE GYNECOLOGY VISIT ENCOUNTER NOTE  Provider location: Center for [facility]   Patient location: Home  I connected with Robin Wright on 04/07/21 at 10:55 AM EDT by telephone and verified that I am speaking with the correct person using two identifiers. Patient was unable to do MyChart audiovisual encounter due to technical difficulties, she tried several times.    I discussed the limitations, risks, security and privacy concerns of performing an evaluation and management service by telephone and the availability of in person appointments. I also discussed with the patient that there may be a patient responsible charge related to this service. The patient expressed understanding and agreed to proceed.   History:  Robin Wright is a 45 y.o. [phone] female being evaluated today for f/u u/s. She has had a CT which showed a possible hydrosalpinx. The u/s showed a fibroid, and no other mass but the right ovary was not seen. She denies any abnormal vaginal discharge, bleeding, pelvic pain or other concerns.       Past Medical History:  Diagnosis Date  . Lumbar herniated disc    Past Surgical History:  Procedure Laterality Date  . c-section    . CESAREAN SECTION     x 3   The following portions of the patient's history were reviewed and updated as appropriate: allergies, current medications, past family history, past medical history, past social history, past surgical history and problem list.   Review of Systems:  Pertinent items noted in HPI and remainder of comprehensive ROS otherwise negative.  Physical Exam:   General:  Alert, oriented and cooperative.   Mental Status: Normal mood and affect perceived. Normal judgment and thought content.  Physical exam deferred due to nature of the encounter  Labs and Imaging No results found for this or any previous visit (from the past 336 hour(s)). US PELVIC COMPLETE WITH TRANSVAGINAL  Result Date:  03/27/2021 CLINICAL DATA:  LEFT pelvic pain, history Caesarean section, tubal ligation. LMP 03/24/2021. History of PID. EXAM: TRANSABDOMINAL AND TRANSVAGINAL ULTRASOUND OF PELVIS TECHNIQUE: Both transabdominal and transvaginal ultrasound examinations of the pelvis were performed. Transabdominal technique was performed for global imaging of the pelvis including uterus, ovaries, adnexal regions, and pelvic cul-de-sac. It was necessary to proceed with endovaginal exam following the transabdominal exam to visualize the endometrium, ovaries, and adnexa. COMPARISON:  CT abdomen and pelvis 01/16/2021 FINDINGS: Uterus Measurements: 12.4 x 8.3 x 8.0 cm = volume: 430 mL. Heterogeneous myometrium with scattered areas of shadowing. Minimal shadowing from Caesarean section scar. Large mass at anterior upper uterus 7.7 x 5.0 x 4.9 cm consistent with large leiomyoma Endometrium Thickness: 8 mm. Displaced posteriorly by anterior upper uterine leiomyoma. No endometrial fluid or mass Right ovary Not visualized, likely obscured by bowel Left ovary Measurements: 3.2 x 1.5 x 2.0 cm = volume: 5.2 mL. Normal morphology without mass Other findings No free pelvic fluid. No adnexal masses. No hydrosalpinx identified. IMPRESSION: Large leiomyoma at anterior upper uterus 7.7 cm greatest size, displacing the endometrial complex posteriorly. Nonvisualization of RIGHT ovary. Remainder of exam unremarkable. Electronically Signed   By: Ulyses Southward M.D.   On: 03/27/2021 16:47      Assessment and Plan:     Problem List Items Addressed This Visit      Unprioritized   Pelvic pain    Discussed possible causes of hydrosalpinx, including BTL. She has intimated she might want her fibroid treated.  Will need to discuss at next visit.  I discussed the assessment and treatment plan with the patient. The patient was provided an opportunity to ask questions and all were answered. The patient agreed with the plan and demonstrated an  understanding of the instructions.   The patient was advised to call back or seek an in-person evaluation/go to the ED if the symptoms worsen or if the condition fails to improve as anticipated.  I provided 5 minutes of non-face-to-face time during this encounter.  Return in about 3 months (around 07/08/2021) for in person and pap smear.  Reva Bores, MD Center for [employer]

## 2021-05-02 ENCOUNTER — Emergency Department (HOSPITAL_COMMUNITY)
Admission: EM | Admit: 2021-05-02 | Discharge: 2021-05-02 | Disposition: A | Discharge status: Home / Self Care | Payer: Medicaid Other | Attending: Emergency Medicine | Admitting: Emergency Medicine

## 2021-05-02 ENCOUNTER — Other Ambulatory Visit: Payer: Self-pay

## 2021-05-02 ENCOUNTER — Encounter (HOSPITAL_COMMUNITY): Payer: Self-pay

## 2021-05-02 ENCOUNTER — Emergency Department (HOSPITAL_COMMUNITY): Payer: Medicaid Other

## 2021-05-02 DIAGNOSIS — K2950 Unspecified chronic gastritis without bleeding: Secondary | ICD-10-CM

## 2021-05-02 DIAGNOSIS — R0602 Shortness of breath: Secondary | ICD-10-CM | POA: Insufficient documentation

## 2021-05-02 DIAGNOSIS — Z87891 Personal history of nicotine dependence: Secondary | ICD-10-CM | POA: Diagnosis not present

## 2021-05-02 DIAGNOSIS — R110 Nausea: Secondary | ICD-10-CM | POA: Diagnosis present

## 2021-05-02 LAB — BASIC METABOLIC PANEL
Anion gap: 11 (ref 5–15)
BUN: 20 mg/dL (ref 6–20)
CO2: 22 mmol/L (ref 22–32)
Calcium: 9.4 mg/dL (ref 8.9–10.3)
Chloride: 103 mmol/L (ref 98–111)
Creatinine, Ser: 1.1 mg/dL — ABNORMAL HIGH (ref 0.44–1.00)
GFR, Estimated: >60 mL/min (ref >60)
Glucose, Bld: 117 mg/dL — ABNORMAL HIGH (ref 70–99)
Potassium: 4.4 mmol/L (ref 3.5–5.1)
Sodium: 136 mmol/L (ref 135–145)

## 2021-05-02 MED ORDER — ALUM & MAG HYDROXIDE-SIMETH 200-200-20 MG/5ML PO SUSP
30.0000 mL | Freq: Once | ORAL | Status: AC
  Administered 2021-05-02: 30 mL via ORAL
  Filled 2021-05-02: qty 30

## 2021-05-02 MED ORDER — ONDANSETRON 4 MG PO TBDP
4.0000 mg | ORAL_TABLET | Freq: Once | ORAL | Status: AC
  Administered 2021-05-02: 4 mg via ORAL
  Filled 2021-05-02: qty 1

## 2021-05-02 MED ORDER — LIDOCAINE VISCOUS HCL 2 % MT SOLN
15.0000 mL | Freq: Once | OROMUCOSAL | Status: AC
  Administered 2021-05-02: 15 mL via ORAL
  Filled 2021-05-02: qty 15

## 2021-05-02 MED ORDER — FAMOTIDINE 20 MG PO TABS: 20.0000 mg | ORAL_TABLET | Freq: Two times a day (BID) | ORAL | 2 refills | Status: AC

## 2021-05-02 MED ORDER — ONDANSETRON 4 MG PO TBDP: 4.0000 mg | ORAL_TABLET | Freq: Three times a day (TID) | ORAL | 0 refills | Status: AC | PRN

## 2021-05-02 NOTE — ED Notes (Signed)
IV team consulted for difficult IV start

## 2021-05-02 NOTE — ED Triage Notes (Addendum)
Patient c/o SOB after receiving the 1st Covid vaccine.  Patient also c/o sore throat, nasal congestion, abdominal pain, and nausea since receiving the vaccine.  Patient added in triage that she had abdominal pain in February and after receiving he vaccine the pain worsened.

## 2021-05-02 NOTE — ED Provider Notes (Signed)
Emergency Medicine Provider Triage Evaluation Note  Robin Wright , a 45 y.o. female  was evaluated in triage.  Pt complains of sob.  Review of Systems  Positive: Chest discomfort, upper abd discomfort, sob, vomit, constipation, back pain Negative: Fever, dysuria  Physical Exam  BP (!) 139/92 (BP Location: Left Arm)   Pulse 85   Temp 99.1 F (37.3 C) (Oral)   Resp 18   SpO2 100%  Gen:   Awake, no distress   Resp:  Normal effort  MSK:   Moves extremities without difficulty  Other:    Medical Decision Making  Medically screening exam initiated at 11:48 AM.  Appropriate orders placed.  Robin Wright was informed that the remainder of the evaluation will be completed by another provider, this initial triage assessment does not replace that evaluation, and the importance of remaining in the ED until their evaluation is complete.  Pt mentioned having heart burn, not tolerating her secretion, sob, back pain, cp, abd pain, constipation.  Had first covid shot 2 days ago.  LBM yesterday.  Seen by pcp for same and was prescribed nose spray, prilosec yesterday.    Fayrene Helper, PA-C 05/02/21 1150    Mancel Bale, MD 05/04/21 [phone]

## 2021-05-02 NOTE — ED Provider Notes (Signed)
[facility]-EMERGENCY DEPT Provider Note   CSN: [id number] Arrival date & time: 05/02/21  1052     History Chief Complaint  Patient presents with   Shortness of Breath    Robin Wright is a 45 y.o. female with a history of obesity, gastritis, presenting to emergency department with shortness of breath and nausea.  The patient reports she got her first dose of the Moderna vaccine on Sunday.  She states that since then she has had heartburn and burning sensation when swallowing, making it difficult to swallow food or fluid.  She feels nauseated.  She has also had some mild shortness of breath, worse at night.  She states her PCP started her on omeprazole but she threw up after try to take the pill.  She does have an appointment and a referral to a GI doctor but is not seen one yet.  She has been taking Tums with some minimal relief at home.  She also has Mylanta at home but has not used this.  HPI     Past Medical History:  Diagnosis Date   Lumbar herniated disc     Patient Active Problem List   Diagnosis Date Noted   Pelvic pain 03/02/2021   Lumbar herniated disc    Memory loss 06/04/2017   Neck pain 06/04/2017    Past Surgical History:  Procedure Laterality Date   c-section     CESAREAN SECTION     x 3     OB History     Gravida  7   Para  4   Term  4   Preterm  0   AB  3   Living  4      SAB  2   IAB  1   Ectopic  0   Multiple  0   Live Births  4           Family History  Problem Relation Age of Onset   Hypertension Mother    Diabetes Mother    Cirrhosis Father     Social History   Tobacco Use   Smoking status: Former    Packs/day: 1.00    Pack years: 0.00    Types: Cigarettes    Start date: 12/26/2001   Smokeless tobacco: Current  Vaping Use   Vaping Use: Never used  Substance Use Topics   Alcohol use: No   Drug use: No    Home Medications Prior to Admission medications   Medication Sig Start Date  End Date Taking? Authorizing Provider  famotidine (PEPCID) 20 MG tablet Take 1 tablet (20 mg total) by mouth 2 (two) times daily. Take 30 minutes before breakfast and dinner 05/02/21 06/01/21 Yes Belem Hintze, Kermit Balo, MD  ondansetron (ZOFRAN ODT) 4 MG disintegrating tablet Take 1 tablet (4 mg total) by mouth every 8 (eight) hours as needed for up to 15 days for nausea or vomiting. 05/02/21 05/17/21 Yes Mckynleigh Mussell, Kermit Balo, MD  acetaminophen (TYLENOL) 325 MG tablet Take 650 mg by mouth every 6 (six) hours as needed.    [provider]  ferrous sulfate 325 (65 FE) MG tablet Take 325 mg by mouth daily with breakfast.    [provider]  gabapentin (NEURONTIN) 100 MG capsule Take 1 capsule (100 mg total) by mouth 3 (three) times daily. 04/11/17   Kerrin Champagne, MD  naproxen (NAPROSYN) 500 MG tablet Take 1 tablet (500 mg total) by mouth 2 (two) times daily. Patient not taking: Reported  on 05/11/2020 08/29/18 01/20/21  Henderly, Britni A, PA-C    Allergies    Patient has no known allergies.  Review of Systems   Review of Systems  Constitutional:  Negative for chills and fever.  HENT:  Positive for sore throat and trouble swallowing.   Respiratory:  Positive for cough and shortness of breath.   Cardiovascular:  Negative for chest pain and palpitations.  Gastrointestinal:  Positive for abdominal pain and nausea.  Musculoskeletal:  Negative for arthralgias and back pain.  Skin:  Negative for color change and rash.  Neurological:  Negative for syncope and light-headedness.  All other systems reviewed and are negative.  Physical Exam Updated Vital Signs BP 124/69 (BP Location: Left Arm)   Pulse 91   Temp 98.8 F (37.1 C) (Oral)   Resp 18   Ht 5\' 5"  (1.651 m)   Wt 132.9 kg   LMP 04/19/2021   SpO2 99%   BMI 48.76 kg/m   Physical Exam Constitutional:      General: She is not in acute distress.    Appearance: She is obese.  HENT:     Head: Normocephalic and atraumatic.  Eyes:      Conjunctiva/sclera: Conjunctivae normal.     Pupils: Pupils are equal, round, and reactive to light.  Cardiovascular:     Rate and Rhythm: Normal rate and regular rhythm.  Pulmonary:     Effort: Pulmonary effort is normal. No respiratory distress.     Breath sounds: Normal breath sounds.  Abdominal:     General: There is no distension.     Tenderness: There is no abdominal tenderness.  Skin:    General: Skin is warm and dry.  Neurological:     General: No focal deficit present.     Mental Status: She is alert. Mental status is at baseline.  Psychiatric:        Mood and Affect: Mood normal.        Behavior: Behavior normal.    ED Results / Procedures / Treatments   Labs (all labs ordered are listed, but only abnormal results are displayed) Labs Reviewed  BASIC METABOLIC PANEL - Abnormal; Notable for the following components:      Result Value   Glucose, Bld 117 (*)    Creatinine, Ser 1.10 (*)    All other components within normal limits    EKG EKG Interpretation  Date/Time:  Tuesday May 02 2021 12:03:42 EDT Ventricular Rate:  77 PR Interval:  151 QRS Duration: 90 QT Interval:  349 QTC Calculation: 395 R Axis:   61 Text Interpretation: Sinus rhythm Consider left ventricular hypertrophy 12 Lead; Mason-Likar Confirmed by [id number] [phone]) on 05/02/2021 6:16:46 PM  Radiology DG Chest 2 View  Result Date: 05/02/2021 CLINICAL DATA:  Shortness of breath for 2 days. EXAM: CHEST - 2 VIEW COMPARISON:  None. FINDINGS: Lungs clear. Heart size normal. No pneumothorax or pleural fluid. No bony abnormality. IMPRESSION: Negative chest. Electronically Signed   By: 05/04/2021 M.D.   On: 05/02/2021 12:07    Procedures Procedures   Medications Ordered in ED Medications  alum & mag hydroxide-simeth (MAALOX/MYLANTA) 200-200-20 MG/5ML suspension 30 mL (30 mLs Oral Given 05/02/21 1703)    And  lidocaine (XYLOCAINE) 2 % viscous mouth solution 15 mL (15 mLs Oral Given 05/02/21  1703)  ondansetron (ZOFRAN-ODT) disintegrating tablet 4 mg (4 mg Oral Given 05/02/21 1703)    ED Course  I have reviewed the triage vital signs and the nursing  notes.  Pertinent labs & imaging results that were available during my care of the patient were reviewed by me and considered in my medical decision making (see chart for details).  45 yo female with obesity here with trouble swallowing, cough She describes heartburn symptoms worse with laying down at night  I suspect she is having reflux gastritis or esophagitis in the setting of her first covid vaccine 3 days ago.    DG chest reviewed - no focal pulm findings.  No hypoxia or fever to suggest PNA or aspiration on my exam.  No wheezing to suggest reactive airway disease.  She is breathing quite comfortably.  BMP reviewed and near baseline - no evidence of significant dehydration here.  We tried a GI cocktail with significant improvement of her painful swallowing.  I'll start her on pepcid as she does not want a PPI (had difficulties swallowing this pill).  Discussed reflux management and GI follow up, which her PCP is helping to arrange.  Otherwise I have a low suspicion for acute PE, PNA, ACS or other medical emergency.  ECG reviewed and showing NSR, no acute ischemic findings per my interpretation.  Clinical Course as of 05/03/21 0957  Tue May 02, 2021  1815 Patient is feeling significantly better after GI cocktail.  I suspect again this is gastritis or esophagitis.  I can prescribe Zofran and Pepcid for home.  She verbalized understanding.  She will follow-up with her PCP. [MT]    Clinical Course User Index [MT] Terald Sleeper, MD   Final Clinical Impression(s) / ED Diagnoses Final diagnoses:  Chronic gastritis, presence of bleeding unspecified, unspecified gastritis type    Rx / DC Orders ED Discharge Orders          Ordered    famotidine (PEPCID) 20 MG tablet  2 times daily        05/02/21 1818    ondansetron  (ZOFRAN ODT) 4 MG disintegrating tablet  Every 8 hours PRN        05/02/21 1818             Terald Sleeper, MD 05/03/21 [id number]

## 2021-05-02 NOTE — ED Notes (Addendum)
Pt reporting 8/10 pain "starting in esophagus and respiratory down to stomach" starting on 04/26/21.

## 2021-05-02 NOTE — ED Notes (Signed)
Patient was given Ginger ale to drink and is currently sipping on after taking medications as ordered

## 2021-05-26 ENCOUNTER — Ambulatory Visit
Admission: RE | Admit: 2021-05-26 | Discharge: 2021-05-26 | Disposition: A | Discharge status: Home / Self Care | Payer: Medicaid Other | Source: Ambulatory Visit | Attending: Family Medicine | Admitting: Family Medicine

## 2021-05-26 ENCOUNTER — Other Ambulatory Visit: Payer: Self-pay

## 2021-05-26 DIAGNOSIS — Z1231 Encounter for screening mammogram for malignant neoplasm of breast: Secondary | ICD-10-CM

## 2021-06-09 ENCOUNTER — Encounter: Payer: Self-pay | Admitting: Gastroenterology

## 2021-06-09 ENCOUNTER — Ambulatory Visit (INDEPENDENT_AMBULATORY_CARE_PROVIDER_SITE_OTHER): Payer: Medicaid Other | Admitting: Gastroenterology

## 2021-06-09 VITALS — BP 134/60 | HR 86 | Ht 65.0 in | Wt 288.0 lb

## 2021-06-09 DIAGNOSIS — D649 Anemia, unspecified: Secondary | ICD-10-CM

## 2021-06-09 DIAGNOSIS — R1310 Dysphagia, unspecified: Secondary | ICD-10-CM

## 2021-06-09 DIAGNOSIS — D509 Iron deficiency anemia, unspecified: Secondary | ICD-10-CM | POA: Diagnosis not present

## 2021-06-09 DIAGNOSIS — Z1211 Encounter for screening for malignant neoplasm of colon: Secondary | ICD-10-CM

## 2021-06-09 DIAGNOSIS — R1013 Epigastric pain: Secondary | ICD-10-CM | POA: Diagnosis not present

## 2021-06-09 DIAGNOSIS — Z1212 Encounter for screening for malignant neoplasm of rectum: Secondary | ICD-10-CM

## 2021-06-09 NOTE — Progress Notes (Signed)
HPI : Robin Wright is a pleasant 45 year old female with a history of depression, anxiety and obesity who is referred by Dr. Payton Emerald, MD for further evaluation of abdominal pain.  Patient states that she has been having daily sharp burning upper abdominal pain for several months.  The pain was worse at night.  She was also having dysphagia and a sensation of food getting stuck in her chest.  She was experiencing burning pain in her chest and "a fire in her mouth".  She had been taking naproxen for tooth ache for a few days before she started having the abdominal pain.  She was started on famotidine and this is helped quite a bit with her symptoms.  For the past 3 weeks now, she has had little to no GI symptoms. She has had anemia for a long time, which has been attributed to heavy menstrual blood loss and fibroids in the past.  However, in February of this year, her hgb was noted to have been much lower than previous (hgb 7.5, MCV 62).  This was also in the setting of her abdominal pain.  She denied any history of melena or hematochezia.  She has a heavy smoking history for the past 16 years and was smoking 2 ppd.  She has cut back and is now at about 1/2 ppd.   She has never had an upper or lower endoscopy.  Past Medical History:  Diagnosis Date   Anxiety    Depression    Lumbar herniated disc      Past Surgical History:  Procedure Laterality Date   c-section     CESAREAN SECTION     x 3   Family History  Problem Relation Age of Onset   Hypertension Mother    Diabetes Mother    Cirrhosis Father    Stomach cancer Neg Hx    Colon cancer Neg Hx    Social History   Tobacco Use   Smoking status: Former    Packs/day: 1.00    Types: Cigarettes    Start date: 12/26/2001   Smokeless tobacco: Current  Vaping Use   Vaping Use: Never used  Substance Use Topics   Alcohol use: No   Drug use: No   Current Outpatient Medications  Medication Sig Dispense Refill   acetaminophen  (TYLENOL) 325 MG tablet Take 650 mg by mouth every 6 (six) hours as needed.     ferrous sulfate 325 (65 FE) MG tablet Take 325 mg by mouth daily with breakfast.     fluticasone (FLONASE) 50 MCG/ACT nasal spray SMARTSIG:2 Spray(s) Both Nares Daily PRN     gabapentin (NEURONTIN) 100 MG capsule Take 1 capsule (100 mg total) by mouth 3 (three) times daily. 30 capsule 6   PROAIR HFA 108 (90 Base) MCG/ACT inhaler SMARTSIG:1-2 Puff(s) By Mouth Every 4-6 Hours PRN     famotidine (PEPCID) 20 MG tablet Take 1 tablet (20 mg total) by mouth 2 (two) times daily. Take 30 minutes before breakfast and dinner 60 tablet 2   No current facility-administered medications for this visit.   No Known Allergies   Review of Systems: All systems reviewed and negative except where noted in HPI.    MM 3D SCREEN BREAST BILATERAL  Result Date: 05/31/2021 CLINICAL DATA:  Screening. EXAM: DIGITAL SCREENING BILATERAL MAMMOGRAM WITH TOMOSYNTHESIS AND CAD TECHNIQUE: Bilateral screening digital craniocaudal and mediolateral oblique mammograms were obtained. Bilateral screening digital breast tomosynthesis was performed. The images were evaluated  with computer-aided detection. COMPARISON:  None. ACR Breast Density Category b: There are scattered areas of fibroglandular density. FINDINGS: There are no findings suspicious for malignancy. IMPRESSION: No mammographic evidence of malignancy. A result letter of this screening mammogram will be mailed directly to the patient. RECOMMENDATION: Screening mammogram in one year. (Code:SM-B-01Y) BI-RADS CATEGORY  1: Negative. Electronically Signed   By: Hulan Saas M.D.   On: 05/31/2021 09:39    Physical Exam: BP 134/60   Pulse 86   Ht 5\' 5"  (1.651 m)   Wt 288 lb (130.6 kg)   SpO2 97%   BMI 47.93 kg/m  Constitutional: Pleasant,well-developed, obese African American female in no acute distress. HEENT: Normocephalic and atraumatic. Conjunctivae are normal. No scleral  icterus. Cardiovascular: Normal rate, regular rhythm.  Pulmonary/chest: Effort normal and breath sounds normal. No wheezing, rales or rhonchi. Abdominal: Soft, nondistended, nontender. Bowel sounds active throughout. There are no masses palpable. No hepatomegaly. Extremities: no edema Neurological: Alert and oriented to person place and time. Skin: Skin is warm and dry. No rashes noted. Psychiatric: Normal mood and affect. Behavior is normal.  CBC    Component Value Date/Time   WBC 5.1 01/16/2021 1942   RBC 4.63 01/16/2021 1942   HGB 7.5 (L) 01/16/2021 1942   HGB 10.6 (L) 06/04/2017 0907   HCT 28.6 (L) 01/16/2021 1942   HCT 36.6 06/04/2017 0907   PLT 473 (H) 01/16/2021 1942   PLT 350 06/04/2017 0907   MCV 61.8 (L) 01/16/2021 1942   MCV 85 06/04/2017 0907   MCH 16.2 (L) 01/16/2021 1942   MCHC 26.2 (L) 01/16/2021 1942   RDW 22.6 (H) 01/16/2021 1942   RDW 18.4 (H) 06/04/2017 0907   LYMPHSABS 1.9 06/04/2017 0907   EOSABS 0.1 06/04/2017 0907   BASOSABS 0.0 06/04/2017 0907    CMP     Component Value Date/Time   NA 136 05/02/2021 1209   NA 140 06/04/2017 0907   K 4.4 05/02/2021 1209   CL 103 05/02/2021 1209   CO2 22 05/02/2021 1209   GLUCOSE 117 (H) 05/02/2021 1209   BUN 20 05/02/2021 1209   BUN 8 06/04/2017 0907   CREATININE 1.10 (H) 05/02/2021 1209   CALCIUM 9.4 05/02/2021 1209   PROT 7.6 01/16/2021 1942   PROT 6.8 06/04/2017 0907   ALBUMIN 3.5 01/16/2021 1942   ALBUMIN 3.5 06/04/2017 0907   AST 17 01/16/2021 1942   ALT 15 01/16/2021 1942   ALKPHOS 54 01/16/2021 1942   BILITOT 0.4 01/16/2021 1942   BILITOT 0.2 06/04/2017 0907   GFRNONAA >60 05/02/2021 1209   GFRAA 117 06/04/2017 0907     ASSESSMENT AND PLAN: 45 year old female with several months of abdominal pain with heartburn and nausea and vomiting, now resolved, but associated with worsening anemia from her baseline IDA.  Her upper symptoms improved with acid suppressive therapy and I suspect that her  symptoms were likely either secondary to GERD, gastritis or PUD.  Although they are resolved, the significant anemia warrants endoscopy.  Will plan for upper and lower endoscopy.  We discussed the pathophysiology of GERD and the principles of GERD management to include lifestyle modifications  such as dietary discretion (avoidance of alcohol, tobacco, caffeinated and carbonated beverages, spicy/greasy foods, citrus, peppermint/chocolate), weight loss if applicable, head of bed elevation andconsuming last meal of day within 3 hours of bedtime; pharmacologic options to include PPIs, H2RAs and OTC antacids; and finally surgical or endoscopic fundoplication.  Iron deficiency anemia, chronic, recently worsened -  Upper endoscopy with gastric and duodenal biopsies to exclude H. Pylori and celiac disease - Colonoscopy - Continue oral iron replacement  Dysphagia, abdominal pain/heartburn, resolved - Continue famotidine   The details, risks (including bleeding, perforation, infection, missed lesions, medication reactions and possible hospitalization or surgery if complications occur), benefits, and alternatives to EGD/colonoscopy with possible biopsy and possible polypectomy were discussed with the patient and she consents to proceed.   I spent a total of 45 minutes reviewing the patient's medical record, interviewing and examining the patient, discussing her diagnosis and management of her condition going forward, and documenting in the medical record   Mckenleigh Tarlton E. Tomasa Rand, MD [facility]    Verlon Au, MD

## 2021-06-09 NOTE — Patient Instructions (Signed)
If you are age 44 or older, your body mass index should be between 23-30. Your Body mass index is 47.93 kg/m. If this is out of the aforementioned range listed, please consider follow up with your Primary Care Provider.  If you are age 14 or younger, your body mass index should be between 19-25. Your Body mass index is 47.93 kg/m. If this is out of the aformentioned range listed, please consider follow up with your Primary Care Provider.   You have been scheduled for an endoscopy and colonoscopy. Please follow the written instructions given to you at your visit today. Please pick up your prep supplies at the pharmacy within the next 1-3 days. If you use inhalers (even only as needed), please bring them with you on the day of your procedure.   __________________________________________________________  The [facility] providers would like to encourage you to use [facility] to communicate with providers for non-urgent requests or questions.  Due to long hold times on the telephone, sending your provider a message by [facility] may be a faster and more efficient way to get a response.  Please allow 48 business hours for a response.  Please remember that this is for non-urgent requests.   It was a pleasure to see you today!  Thank you for trusting me with your gastrointestinal care!    Scott E. Tomasa Rand, MD

## 2021-06-13 ENCOUNTER — Encounter: Payer: Self-pay | Admitting: Gastroenterology

## 2021-07-20 ENCOUNTER — Encounter: Payer: Medicaid Other | Admitting: Gastroenterology

## 2022-09-10 IMAGING — US US PELVIS COMPLETE WITH TRANSVAGINAL
1 series · 15 of 25 positions shown · non-contrast
Comparison: CT abdomen and pelvis 01/16/2021

CLINICAL DATA: LEFT pelvic pain, history Caesarean section, tubal
ligation. LMP 03/24/2021. History of PID.

EXAM:
TRANSABDOMINAL AND TRANSVAGINAL ULTRASOUND OF PELVIS
TECHNIQUE: Both transabdominal and transvaginal ultrasound examinations of the
pelvis were performed. Transabdominal technique was performed for
global imaging of the pelvis including uterus, ovaries, adnexal
regions, and pelvic cul-de-sac. It was necessary to proceed with
endovaginal exam following the transabdominal exam to visualize the
endometrium, ovaries, and adnexa.

[Series 1: us pelvis complete with transvaginal · 15 of 101 slices shown]
[im 1/101]
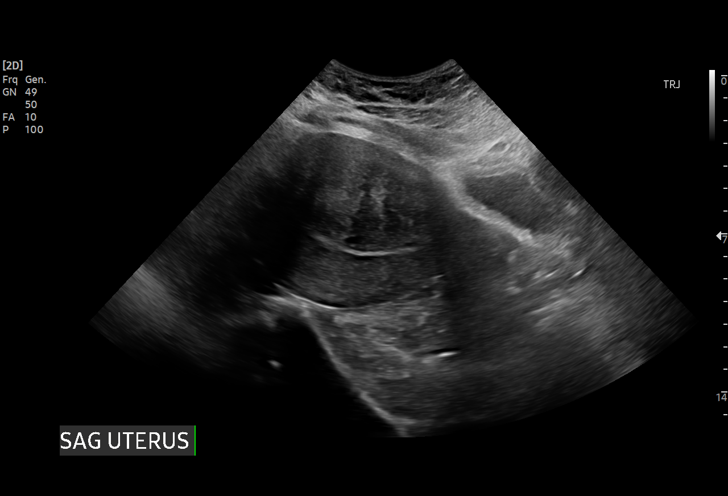
[im 9/101]
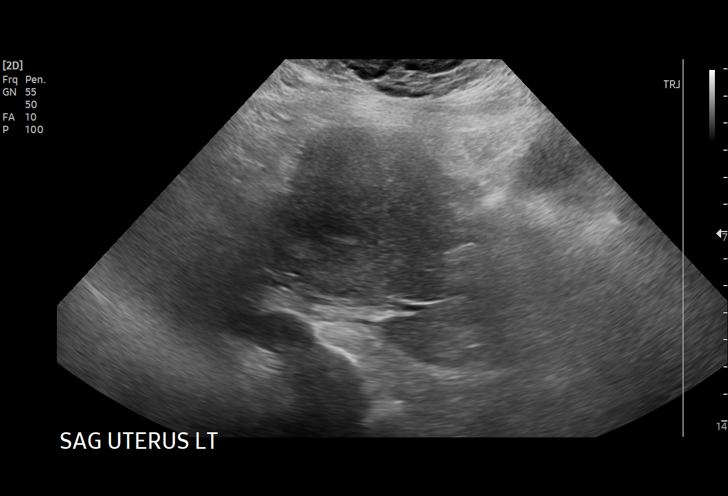
[im 17/101]
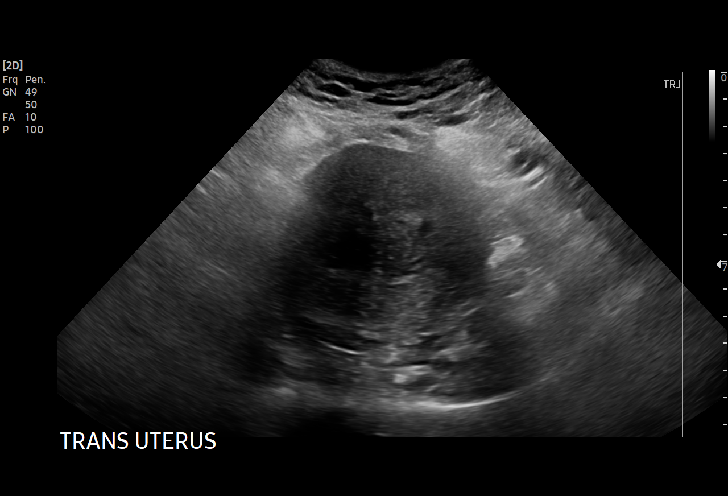
[im 21/101]
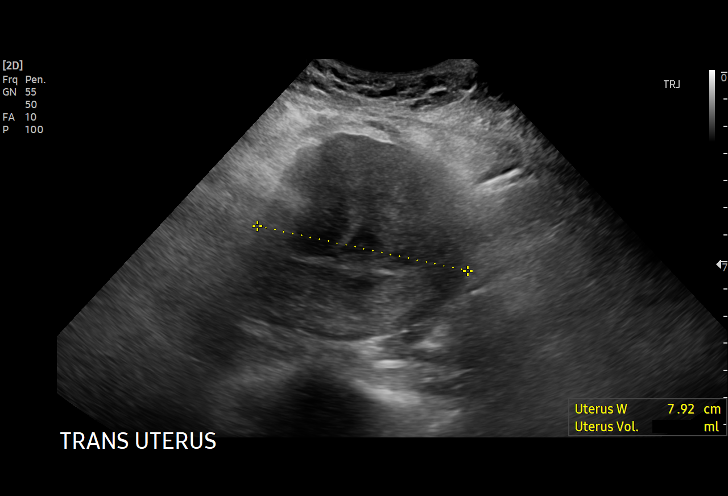
[im 30/101]
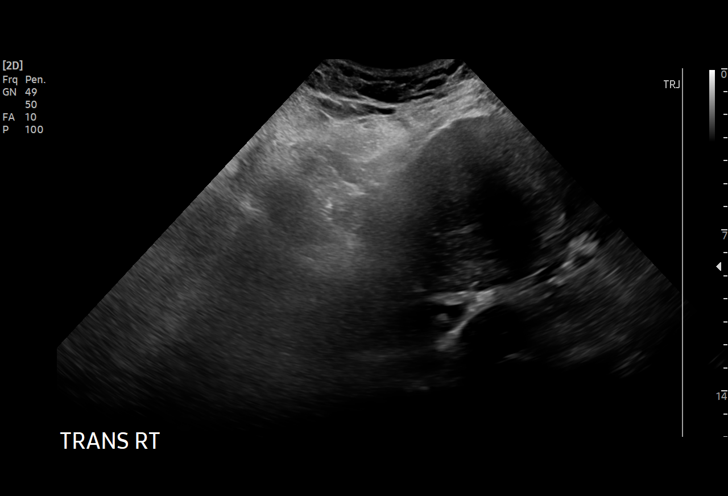
[im 38/101]
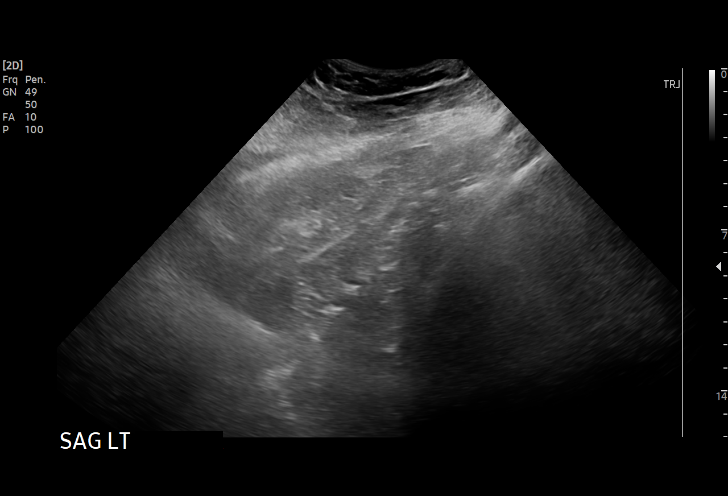
[im 42/101]
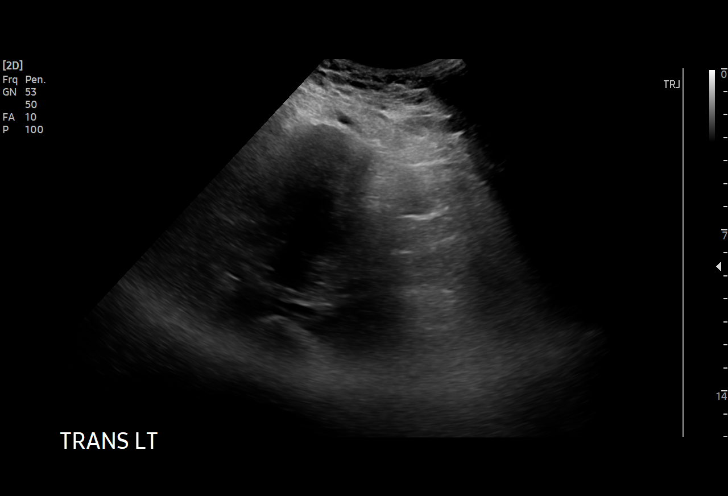
[im 51/101]
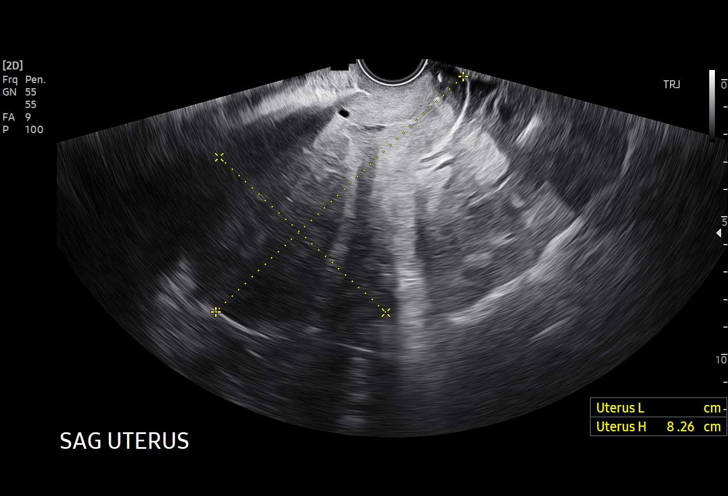
[im 59/101]
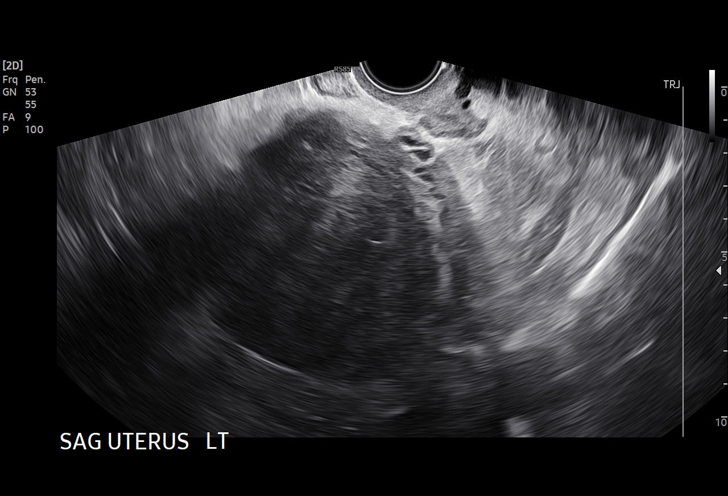
[im 63/101]
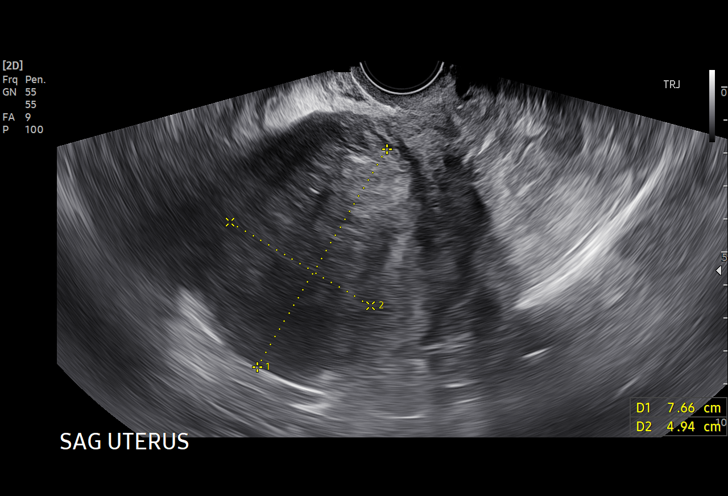
[im 71/101]
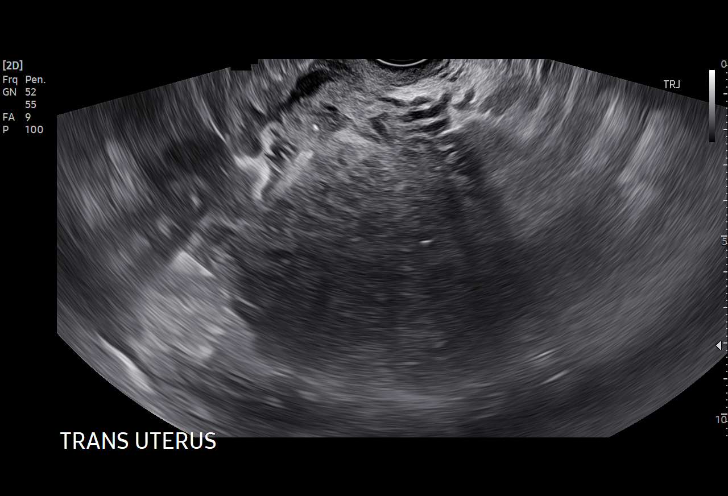
[im 80/101]
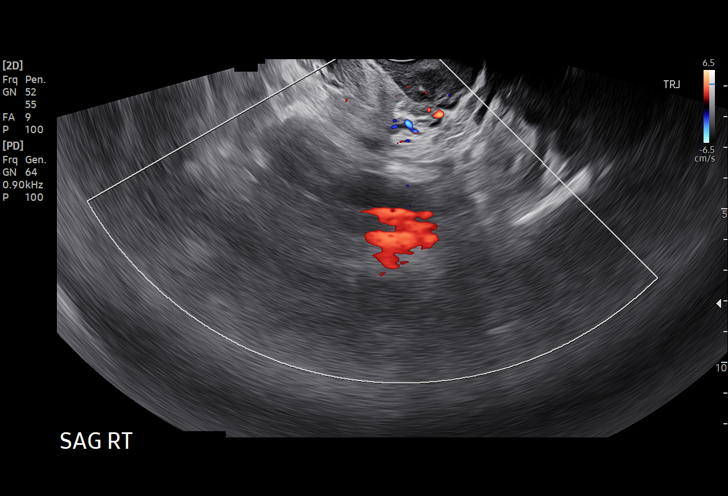
[im 84/101]
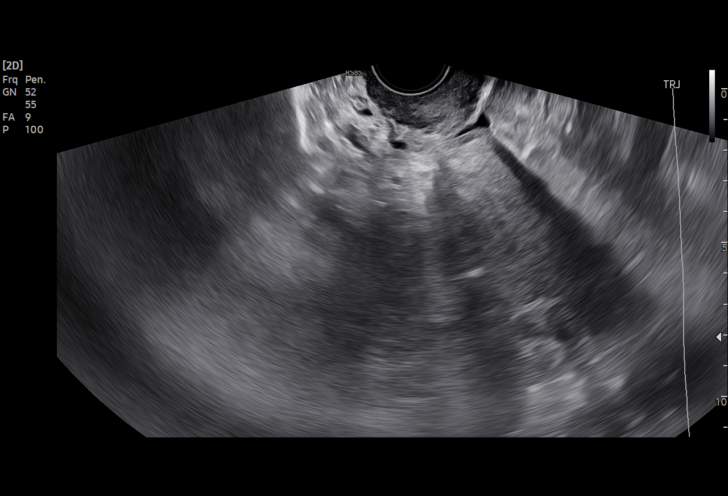
[im 92/101]
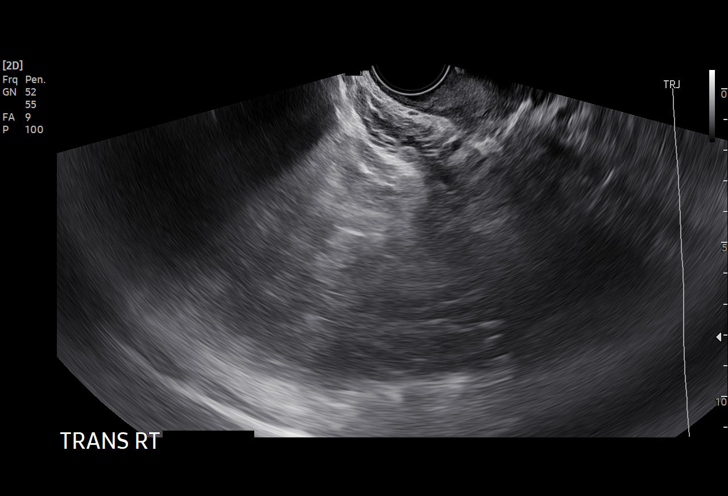
[im 101/101]
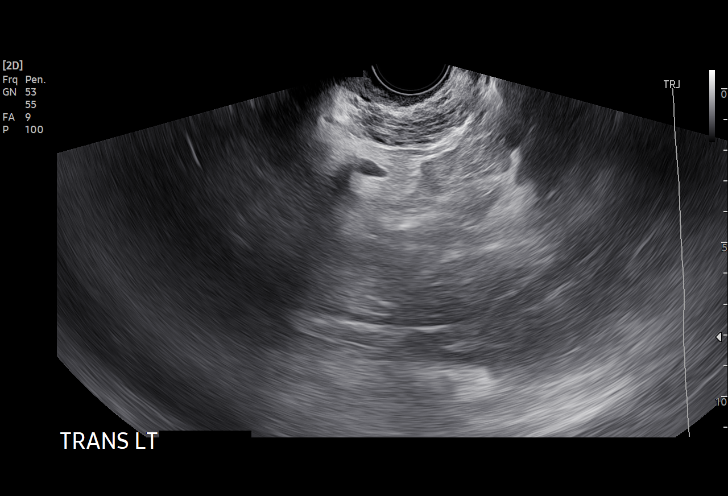

[15 of 25 positions shown; findings below may reference images not displayed]

FINDINGS: Uterus

Measurements: 12.4 x 8.3 x 8.0 cm = volume: 430 mL. Heterogeneous
myometrium with scattered areas of shadowing. Minimal shadowing from
Caesarean section scar. Large mass at anterior upper uterus 7.7 x
5.0 x 4.9 cm consistent with large leiomyoma

Endometrium

Thickness: 8 mm. Displaced posteriorly by anterior upper uterine
leiomyoma. No endometrial fluid or mass

Right ovary

Not visualized, likely obscured by bowel

Left ovary

Measurements: 3.2 x 1.5 x 2.0 cm = volume: 5.2 mL. Normal morphology
without mass

Other findings

No free pelvic fluid. No adnexal masses. No hydrosalpinx identified.
IMPRESSION: Large leiomyoma at anterior upper uterus 7.7 cm greatest size,
displacing the endometrial complex posteriorly.

Nonvisualization of RIGHT ovary.

Remainder of exam unremarkable.
